# Patient Record
Sex: Male | Born: 1955 | Race: White | Hispanic: No | Marital: Single | Smoking: Former smoker
Health system: Southern US, Community
[De-identification: ages and names within clinical notes are randomized; demographics above are authoritative.]

## PROBLEM LIST (undated history)

## (undated) DIAGNOSIS — E669 Obesity, unspecified: Secondary | ICD-10-CM

## (undated) DIAGNOSIS — K5090 Crohn's disease, unspecified, without complications: Secondary | ICD-10-CM

## (undated) DIAGNOSIS — I10 Essential (primary) hypertension: Secondary | ICD-10-CM

## (undated) DIAGNOSIS — J449 Chronic obstructive pulmonary disease, unspecified: Secondary | ICD-10-CM

## (undated) HISTORY — PX: FOOT SURGERY: SHX648

---

## 1999-05-19 ENCOUNTER — Emergency Department (HOSPITAL_COMMUNITY): Admission: EM | Admit: 1999-05-19 | Discharge: 1999-05-19 | Payer: Self-pay | Admitting: Emergency Medicine

## 2009-06-22 ENCOUNTER — Ambulatory Visit: Payer: Self-pay | Admitting: Cardiovascular Disease

## 2009-06-22 ENCOUNTER — Inpatient Hospital Stay (HOSPITAL_COMMUNITY): Admission: EM | Admit: 2009-06-22 | Discharge: 2009-06-23 | Payer: Self-pay | Admitting: Emergency Medicine

## 2011-03-23 LAB — BASIC METABOLIC PANEL
BUN: 12 mg/dL (ref 6–23)
CO2: 26 mEq/L (ref 19–32)
CO2: 29 mEq/L (ref 19–32)
Calcium: 9.1 mg/dL (ref 8.4–10.5)
Chloride: 103 mEq/L (ref 96–112)
Creatinine, Ser: 0.98 mg/dL (ref 0.4–1.5)
GFR calc Af Amer: >60 mL/min (ref >60)
Glucose, Bld: 86 mg/dL (ref 70–99)
Potassium: 3.8 mEq/L (ref 3.5–5.1)
Sodium: 136 mEq/L (ref 135–145)

## 2011-03-23 LAB — CBC
HCT: 44.9 % (ref 39.0–52.0)
Hemoglobin: 15.7 g/dL (ref 13.0–17.0)
MCHC: 34.4 g/dL (ref 30.0–36.0)
MCHC: 35 g/dL (ref 30.0–36.0)
Platelets: 134 10*3/uL — ABNORMAL LOW (ref 150–400)
RBC: 4.64 MIL/uL (ref 4.22–5.81)
RDW: 12.8 % (ref 11.5–15.5)
RDW: 13 % (ref 11.5–15.5)

## 2011-03-23 LAB — LIPID PANEL
HDL: 37 mg/dL — ABNORMAL LOW (ref >39)
Triglycerides: 115 mg/dL (ref <150)

## 2011-03-23 LAB — TROPONIN I: Troponin I: 0.01 ng/mL (ref 0.00–0.06)

## 2011-03-23 LAB — TSH: TSH: 2.081 u[IU]/mL (ref 0.350–4.500)

## 2011-03-23 LAB — CARDIAC PANEL(CRET KIN+CKTOT+MB+TROPI): Relative Index: 3.3 — ABNORMAL HIGH (ref 0.0–2.5)

## 2011-03-23 LAB — CK TOTAL AND CKMB (NOT AT ARMC): Relative Index: 3.4 — ABNORMAL HIGH (ref 0.0–2.5)

## 2011-03-23 LAB — PROTIME-INR
INR: 1 (ref 0.00–1.49)
Prothrombin Time: 13.6 seconds (ref 11.6–15.2)

## 2011-03-23 LAB — D-DIMER, QUANTITATIVE: D-Dimer, Quant: 0.32 ug/mL-FEU (ref 0.00–0.48)

## 2011-04-29 NOTE — Discharge Summary (Signed)
Rick Meadows, Rick Meadows                ACCOUNT NO.:  [id number]   MEDICAL RECORD NO.:  [id number]          PATIENT TYPE:  INP   LOCATION:  3702                         FACILITY:  [facility]   PHYSICIAN:  Noralyn Pick. Eden Emms, MD, FACCDATE OF BIRTH:  May 14, 1956   DATE OF ADMISSION:  06/22/2009  DATE OF DISCHARGE:  06/23/2009                               DISCHARGE SUMMARY   DISCHARGE DIAGNOSIS:  Noncardiac chest pain.   SECONDARY DIAGNOSES:  1. Hypertension.  2. Asthma.  3. Gastroesophageal reflux disease.  4. Anxiety disorder.   PROCEDURES:  Cardiac catheterization.   REASON FOR ADMISSION:  Mr. Hamon is a 55 year old male, with no prior  history of heart disease, but with several cardiac risk factors,  including longstanding tobacco smoking.  He apparently was recently  hospitalized at [facility] with chest pain, ruled out  for myocardial infarction and had an outpatient adenosine stress  Myoview, which was reportedly negative.  He presented to [facility] ED  with recurrent chest pain and was admitted for further evaluation.   HOSPITAL COURSE:  The patient ruled out for myocardial infarction with  negative serial cardiac markers.   Cardiac catheterization was performed, by Dr. Clifton James.   Plan was to proceed with coronary angiography, and this yielded no  angiographic evidence of CAD.  Left ventricular function was normal.  No  further workup was indicated.   The patient was cleared for discharge the following morning, in  hemodynamically stable condition.  There were no noted complications of  the right groin incision site.  Dr. Eden Emms suggested follow up with his  primary care physician, Dr. Marina Goodell, and to consider further evaluation  for GI etiology.   MEDICATION ADJUSTMENTS:  Aspirin down titrated to 81 mg daily.   DISCHARGE LABORATORY DATA:  Normal CBC.  D-dimer 0.32, potassium 4.0,  BUN 12, creatinine 1.0, total cholesterol 162, triglyceride 115, HDL 37,  and  LDL 102.   DISPOSITION:  Stable.   DISCHARGE MEDICATIONS:  1. Coated aspirin 81 mg daily.  2. Ramipril 10 mg daily.  3. Hydrochlorothiazide 12.5 mg daily.  4. Diazepam 2.5 mg as needed.  5. Cyclobenzaprine 10 mg as needed.  6. Lexapro 20 mg daily.  7. Vicodin 5/500 mg as needed.  8. Omeprazole 20 mg daily.  9. Fish oil daily.   INSTRUCTIONS:  1. The patient is to call and schedule followup with his primary care      physician, Dr. Brent Bulla, in approximately 2 weeks.  2. The patient is to contact our office in the event that he develops      any swelling/bleeding of the right groin incision site.   DISCHARGE ENCOUNTER:  Greater than 30 minutes, including physician time.      Gene Serpe, PA-C      Peter C. Eden Emms, MD, [facility]  Electronically Signed    GS/MEDQ  D:  06/23/2009  T:  06/23/2009  Job:  [id number]   cc:   Dr. Brent Bulla

## 2011-04-29 NOTE — H&P (Signed)
NAMECRISTOVAL, Rick Meadows                ACCOUNT NO.:  [id number]   MEDICAL RECORD NO.:  [id number]          PATIENT TYPE:  INP   LOCATION:  3702                         FACILITY:  [facility]   PHYSICIAN:  Noralyn Pick. Eden Emms, MD, FACCDATE OF BIRTH:  [birth year]   DATE OF ADMISSION:  06/22/2009  DATE OF DISCHARGE:                              HISTORY & PHYSICAL   PRIMARY CARDIOLOGIST (NEW):  Theron Arista C. Eden Emms, MD, [facility].   PRIMARY CARE DOCTOR:  Verdia Kuba, MD.   CHIEF COMPLAINT:  Chest pain.   HISTORY OF PRESENT ILLNESS:  Rick Meadows is a 55 year old Caucasian male  with no known history of coronary artery disease but risk factors  including remote tobacco abuse disorder 20-pack year, quitting 10 years  ago, obesity, sedentary lifestyle, and severe hypertension as well as a  history of GERD, depression, and anxiety, presenting with worsening  typical chest pain over the last 6 months.  The patient reports first  noticing chest pain, band-like, brought on with exertion, associated  with shortness of breath, nausea, and diaphoresis approximately 6 months  ago.  He also noted a significant increase in his blood pressure with  activity at that time.  Symptoms have been somewhat stable until about 3  weeks ago when he had especially severe episode while mowing the lawn.  The patient reports symptoms did not totally resolve for 4 days.  They  waxed and waned over that period until he finally presented to his  primary care doctor where physician assistant sent him to [facility] for evaluation.  There, he was evaluated and sent home and told  to follow up with his primary care doctor.  The patient continued to  have symptoms and presented to [facility] where he was  admitted and ruled out for MI over the few days and sent home with new  medications, then had an appointment for adenosine stress Myoview.  He  had his Myoview this last Wednesday June 20, 2009, which was negative.  However, he continues to have symptoms and was seen by his primary care  today who saw lateral T-wave inversion and sent him to [facility] for further eval and possible cardiac catheterization.  Since  arrival to the emergency department here at [facility], the patient has  been asymptomatic, hypotensives in 170s/100s, heart rate within normal  limits, O2 saturation greater than 95% on room air, and EKG with  nonspecific ST changes in inferior lateral leads.   PAST MEDICAL HISTORY:  1. Hypertension.  2. Obesity.  3. Depression.  4. Anxiety.  5. GERD.  6. History of asthma.  7. Seasonal allergies.  8. Chronic low back pain.   SOCIAL HISTORY:  The patient lives in [place] with his wife.  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  He has a  20-pack year smoking history, quitting 10 years ago.  He consumes 8 to  14 drinks per week, not consuming more than 2 to 3 in 1 day.  He denies  illicit drug use.  He takes herbal medications including fish oil and  garlic.  He has had a regular diet up until his symptoms over the last  few months he has tried to have a heart healthy diet.  He does no  regular exercise.   FAMILY HISTORY:  Nonsignificant for premature coronary artery disease.  However, both of his mother and father did have MIs in their 24s.  His  mother had a valvular disorder unspecified.   REVIEW OF SYSTEMS:  The patient reports chronic tinnitus for which he  has received treatments that is ongoing.  He also has chronic insomnia.  He has been nausea, vomiting and diarrhea 3 weeks ago which has since  resolved.  He denies orthopnea, PND, edema, palpitations, presyncope,  cough, or wheezing.  No fevers or chills.  No bleeding noted.  Otherwise  see HPI.  All other systems were reviewed and were negative.   ALLERGIES:  NKDA.   MEDICATIONS:  1. Fexofenadine 180 mg p.o. daily.  2. Diazepam 2.5 mg p.r.n. for anxiety/muscle pain.  3.  Cyclobenzaprine 10 mg p.r.n. for lower back pain.  4. Ramipril 10 mg p.o. daily.  5. HCTZ 12.5 mg daily.  6. Lexapro 20 mg daily.  7. Vicodin 5/500 mg p.r.n.  8. Fish oil.  9. Garlic.  10.Omeprazole 20 mg p.o. daily.  11.Aspirin 325 mg p.o. daily.   PHYSICAL EXAMINATION:  VITAL SIGNS:  Temp 98.3 degrees Fahrenheit, BP  147/85 down from 170/100, pulse 71, respiration rate 20, O2 saturations  97% on room air.  GENERAL:  The patient is alert and oriented x3, and in no apparent  distress.  Able to speak and move easily without any respiratory  distress  HEENT:  Head, normocephalic and atraumatic.  Pupils are equal, round,  and reactive to light.  Extraocular muscles are intact.  Nares are clear  and without discharge.  Dentition is good.  Oropharynx is without  erythema or exudate.  NECK:  Supple without lymphadenopathy.  No JVD.  No thyromegaly.  No  bruits.  HEART:  Regular rate with audible S1 and S2.  No clicks, rubs, murmurs,  or gallops.  Pulses are 2+ and equal in both upper extremities  bilaterally.  LUNGS:  Clear to auscultation bilaterally.  SKIN:  No rashes, lesions, or petechiae.  ABDOMEN:  Soft, nontender, and nondistended.  Normal abdominal bowel  sounds.  No rebound or guarding.  No hepatosplenomegaly.  No pulsations.  EXTREMITIES:  No clubbing, cyanosis, or edema.  MUSCULOSKELETAL:  No joint deformities or effusions.  No spinal or CVA  tenderness.  NEURO:  Cranial nerves II through XII are grossly intact.  Strength is  5/5 in all extremities and axial groups.  Normal sensation throughout.  Normal cerebellar function.   RADIOLOGY:  Chest x-ray pending.   EKG, sinus rhythm at a rate of 68 with nonspecific ST-T wave changes, no  significant Q waves.  Axis is normal.  No evidence of hypertrophy.  PR  188, QRS 94, QTc 489, T-wave inversion seen in the lateral leads from  earlier EKG today have resolved.   LABORATORY DATA:  Pending.   ASSESSMENT AND PLAN:  Rick Meadows  is a 55 year old Caucasian male with no  known history of coronary artery disease with multiple risk factors  described above who has had ongoing chest pain over the last 6 months  with significant increase in blood pressure associated with his  symptomatology.  The patient has had a negative workup thus far but  due  to his compelling history and risk factors as well as his ongoing  symptoms, we favor cardiac catheterization planning for later this  afternoon as the patient has eaten this morning.  We will continue his  home medications.  No heparin at this time as the patient is  hemodynamically stable and asymptomatic.  Routine labs and chest x-ray  are all pending.  We will admit to telemetry at this time.      Jarrett Ables, PAC      Peter C. Eden Emms, MD, [facility]  Electronically Signed    MS/MEDQ  D:  06/22/2009  T:  06/23/2009  Job:  [id number]

## 2011-04-29 NOTE — Cardiovascular Report (Signed)
Rick Meadows, Rick Meadows                ACCOUNT NO.:  [id number]   MEDICAL RECORD NO.:  [id number]          PATIENT TYPE:  INP   LOCATION:  1828                         FACILITY:  [facility]   PHYSICIAN:  Verne Carrow, MDDATE OF BIRTH:  Nov 20, 1956   DATE OF PROCEDURE:  06/22/2009  DATE OF DISCHARGE:                            CARDIAC CATHETERIZATION   PRIMARY CARDIOLOGIST:  Theron Arista C. Eden Emms, MD, [facility]   PROCEDURES PERFORMED:  1. Left heart catheterization  2. Selective coronary angiography.  3. Left ventricular angiogram.   OPERATOR:  Verne Carrow, MD   INDICATIONS:  Chest pain in a 55 year old Caucasian male with a history  of hypertension and remote tobacco abuse.   PROCEDURE IN DETAIL:  The patient was brought into the main cardiac  catheterization laboratory after signing informed consent for the  procedure.  The right groin was prepped and draped in a sterile fashion.  Lidocaine 1% was used for local anesthesia.  Standard diagnostic  catheters were used to perform selective coronary angiography.  A  pigtail catheter was used to perform a left ventricular angiogram.  The  patient tolerated the procedure well and was taken to the holding area  in stable condition.   HEMODYNAMIC FINDINGS:  Central aortic pressure 121/81.  Left ventricular  pressure 115/7.  Left ventricular end-diastolic pressure 16.   ANGIOGRAPHIC FINDINGS:  1. The left main coronary artery had no evidence of disease.  2. The circumflex artery was composed mainly of a large obtuse      marginal branch.  There was no angiographic evidence of disease in      this system.  3. Left anterior descending is a large vessel that courses to the apex      and gives off two moderate-sized diagonal branches.  There is no      angiographic evidence of significant disease in this system.  4. The right coronary artery is a large dominant vessel that gives off      a posterior descending artery and a posterolateral  branch.  There      is no angiographic evidence of disease in this system.  5. Left ventricular angiogram was performed in the RAO projection and      shows normal left ventricular systolic function with no wall motion      abnormalities.  Ejection fraction 65%.   IMPRESSION:  1. No angiographic evidence of coronary artery disease.  2. Normal left ventricular systolic function.   RECOMMENDATIONS:  No further ischemic workup at this time.  The patient  will be admitted tonight for bedrest.      Verne Carrow, MD  Electronically Signed     CM/MEDQ  D:  06/22/2009  T:  06/23/2009  Job:  [id number]

## 2015-01-07 ENCOUNTER — Emergency Department (HOSPITAL_COMMUNITY): Payer: PRIVATE HEALTH INSURANCE

## 2015-01-07 ENCOUNTER — Inpatient Hospital Stay (HOSPITAL_COMMUNITY)
Admission: EM | Admit: 2015-01-07 | Discharge: 2015-01-10 | DRG: 313 | Disposition: A | Discharge status: Home / Self Care | Payer: PRIVATE HEALTH INSURANCE | Attending: Internal Medicine | Admitting: Internal Medicine

## 2015-01-07 ENCOUNTER — Encounter (HOSPITAL_COMMUNITY): Payer: Self-pay | Admitting: Emergency Medicine

## 2015-01-07 DIAGNOSIS — R079 Chest pain, unspecified: Secondary | ICD-10-CM

## 2015-01-07 DIAGNOSIS — I10 Essential (primary) hypertension: Secondary | ICD-10-CM | POA: Diagnosis present

## 2015-01-07 DIAGNOSIS — I82409 Acute embolism and thrombosis of unspecified deep veins of unspecified lower extremity: Secondary | ICD-10-CM | POA: Diagnosis present

## 2015-01-07 DIAGNOSIS — J449 Chronic obstructive pulmonary disease, unspecified: Secondary | ICD-10-CM | POA: Diagnosis present

## 2015-01-07 DIAGNOSIS — E669 Obesity, unspecified: Secondary | ICD-10-CM | POA: Diagnosis present

## 2015-01-07 DIAGNOSIS — Z87891 Personal history of nicotine dependence: Secondary | ICD-10-CM

## 2015-01-07 DIAGNOSIS — R0602 Shortness of breath: Secondary | ICD-10-CM

## 2015-01-07 DIAGNOSIS — Z7951 Long term (current) use of inhaled steroids: Secondary | ICD-10-CM

## 2015-01-07 DIAGNOSIS — Z6835 Body mass index (BMI) 35.0-35.9, adult: Secondary | ICD-10-CM

## 2015-01-07 DIAGNOSIS — F1099 Alcohol use, unspecified with unspecified alcohol-induced disorder: Secondary | ICD-10-CM | POA: Diagnosis present

## 2015-01-07 DIAGNOSIS — I82442 Acute embolism and thrombosis of left tibial vein: Secondary | ICD-10-CM | POA: Diagnosis present

## 2015-01-07 DIAGNOSIS — K5090 Crohn's disease, unspecified, without complications: Secondary | ICD-10-CM | POA: Diagnosis present

## 2015-01-07 DIAGNOSIS — Z7982 Long term (current) use of aspirin: Secondary | ICD-10-CM

## 2015-01-07 DIAGNOSIS — F1010 Alcohol abuse, uncomplicated: Secondary | ICD-10-CM | POA: Diagnosis present

## 2015-01-07 DIAGNOSIS — Z79899 Other long term (current) drug therapy: Secondary | ICD-10-CM

## 2015-01-07 HISTORY — DX: Chronic obstructive pulmonary disease, unspecified: J44.9

## 2015-01-07 HISTORY — DX: Obesity, unspecified: E66.9

## 2015-01-07 HISTORY — DX: Crohn's disease, unspecified, without complications: K50.90

## 2015-01-07 HISTORY — DX: Essential (primary) hypertension: I10

## 2015-01-07 LAB — I-STAT TROPONIN, ED: Troponin i, poc: 0 ng/mL (ref 0.00–0.08)

## 2015-01-07 LAB — CBC
HEMATOCRIT: 43.9 % (ref 39.0–52.0)
Hemoglobin: 14.9 g/dL (ref 13.0–17.0)
MCH: 31.6 pg (ref 26.0–34.0)
MCHC: 33.9 g/dL (ref 30.0–36.0)
MCV: 93.2 fL (ref 78.0–100.0)
PLATELETS: 96 10*3/uL — AB (ref 150–400)
RBC: 4.71 MIL/uL (ref 4.22–5.81)
RDW: 13.2 % (ref 11.5–15.5)
WBC: 9.8 10*3/uL (ref 4.0–10.5)

## 2015-01-07 LAB — BASIC METABOLIC PANEL
Anion gap: 6 (ref 5–15)
BUN: 16 mg/dL (ref 6–23)
CALCIUM: 8.7 mg/dL (ref 8.4–10.5)
CO2: 25 mmol/L (ref 19–32)
CREATININE: 0.86 mg/dL (ref 0.50–1.35)
Chloride: 101 mmol/L (ref 96–112)
GFR calc non Af Amer: >90 mL/min (ref >90)
Glucose, Bld: 150 mg/dL — ABNORMAL HIGH (ref 70–99)
POTASSIUM: 3.8 mmol/L (ref 3.5–5.1)
SODIUM: 132 mmol/L — AB (ref 135–145)

## 2015-01-07 LAB — BRAIN NATRIURETIC PEPTIDE: B Natriuretic Peptide: 20.1 pg/mL (ref 0.0–100.0)

## 2015-01-07 NOTE — ED Provider Notes (Signed)
CSN: [id number]     Arrival date & time 01/07/15  2250 History  This chart was scribed for Olivia Mackie, MD by Annye Asa, ED Scribe. This patient was seen in room D34C/D34C and the patient's care was started at 11:52 PM.    Chief Complaint  Patient presents with  . Chest Pain   Patient is a 59 y.o. male presenting with chest pain. The history is provided by the patient. No language interpreter was used.  Chest Pain Associated symptoms: shortness of breath      HPI Comments: Rick Meadows is a 59 y.o. male with past medical history of HTN, COPD, Crohn's disease who presents to the Emergency Department complaining of chest pain and SOB, beginning this afternoon. He describes his chest pain as a "heaviness" rated 6/10; his SOB is worse with ambulation. Tonight, patient explains that he was walking through [place] and had to use a motorized scooter due to current symptoms, which was unusual for him. He also reports that his left hand began tingling tonight. He is currently utilizing an inhaler for COPD as needed; he was previously on steroids but finished that course three days PTA. He denies any unusual swelling, nausea, diaphoresis, cough, congestion.   Patient repots that two weeks PTA he went to [facility] and was diagnosed with COPD; at that time he was told he had "had a heart attack" via a stress test. He was in the hospital for 6 days total; he has work two days since that time.   Past Medical History  Diagnosis Date  . Hypertension   . COPD (chronic obstructive pulmonary disease)   . Crohn's disease    History reviewed. No pertinent past surgical history. No family history on file. History  Substance Use Topics  . Smoking status: Former Games developer  . Smokeless tobacco: Not on file  . Alcohol Use: No    Review of Systems  Respiratory: Positive for shortness of breath.   Cardiovascular: Positive for chest pain.  Neurological:       Paresthesia of the left hand  All other  systems reviewed and are negative.   Allergies  Review of patient's allergies indicates no known allergies.  Home Medications   Prior to Admission medications   Not on File   BP 131/66 mmHg  Pulse 66  Temp(Src) 97.9 F (36.6 C) (Oral)  Resp 24  Ht  (1.93 m)  Wt 290 lb (131.543 kg)  BMI 35.31 kg/m2  SpO2 94% Physical Exam  Constitutional: He is oriented to person, place, and time. He appears well-developed and well-nourished.  HENT:  Head: Normocephalic and atraumatic.  Mouth/Throat: Oropharynx is clear and moist. No oropharyngeal exudate.  Eyes: EOM are normal. Pupils are equal, round, and reactive to light.  Cardiovascular: Normal rate, regular rhythm and normal heart sounds.  Exam reveals no gallop and no friction rub.   No murmur heard. Pulmonary/Chest: Effort normal. No respiratory distress. He has no wheezes. He has no rales.  Prolonged expiratory phase  Abdominal: Soft. There is no tenderness. There is no rebound and no guarding.  Musculoskeletal: Normal range of motion. He exhibits no edema.  Neurological: He is alert and oriented to person, place, and time.  Skin: Skin is warm and dry. No rash noted.  Psychiatric: He has a normal mood and affect. His behavior is normal.  Nursing note and vitals reviewed.   ED Course  Procedures   DIAGNOSTIC STUDIES: Oxygen Saturation is 94% on RA, low by  my interpretation.    COORDINATION OF CARE: 12:00 AM Discussed treatment plan with pt at bedside and pt agreed to plan.  Labs Review Labs Reviewed  CBC - Abnormal; Notable for the following:    Platelets 96 (*)    All other components within normal limits  BASIC METABOLIC PANEL - Abnormal; Notable for the following:    Sodium 132 (*)    Glucose, Bld 150 (*)    All other components within normal limits  BRAIN NATRIURETIC PEPTIDE  TROPONIN I  TROPONIN I  TROPONIN I  I-STAT TROPOININ, ED    Imaging Review Dg Chest 2 View  01/07/2015   CLINICAL DATA:   Left-sided chest and arm pain with shortness of breath tonight. History of hypertension and COPD.  EXAM: CHEST  2 VIEW  COMPARISON:  12/17/2014  FINDINGS: Normal heart size and pulmonary vascularity. Linear fibrosis or atelectasis in the lung bases. No focal airspace consolidation. Probable old left rib fractures. Tortuous aorta. Mediastinal contours appear intact. No blunting of costophrenic angles. No pneumothorax.  IMPRESSION: Shallow inspiration with atelectasis or fibrosis in the lung bases. No active consolidation.   Electronically Signed   By: Burman Nieves M.D.   On: 01/07/2015 23:35     EKG Interpretation   Date/Time:  Sunday January 07 2015 22:54:42 EST Ventricular Rate:  65 PR Interval:  166 QRS Duration: 88 QT Interval:  400 QTC Calculation: 416 R Axis:   75 Text Interpretation:  Normal sinus rhythm Nonspecific ST and T wave  abnormality Abnormal ECG Confirmed by Lita Flynn  MD, Shareef Eddinger ([id number]) on 01/07/2015  10:59:29 PM      MDM   Final diagnoses:  Chest pain  SOB (shortness of breath)    I personally performed the services described in this documentation, which was scribed in my presence. The recorded information has been reviewed and is accurate.     3:09 AM Prior records reviewed from [facility].  Patient was admitted on 12/17/2014, discharged on 12/23/2014.  Diagnosis on upon discharge 1.  Acute exacerbation of COPD, discharged on Levaquin and steroid taper.  2.  Alcohol withdrawal, 3.  Abnormal EKG, he had a Cardiolite stress test negative for ischemia.  EF was 68%.  4.  Hypertension emergency, patient had diltiazem added to his regimen and beta blocker was decreased.  #5.  Crohn's disease.  Per patient, he had been doing well since discharge, but over the last day while walking.  He has had worsening symptoms with substernal chest pressure radiation down his left arm, which he did not have during his prior evaluation.  No wheezing on exam.  He does not appear to be  in a COPD exacerbation.  Will consult cardiology for their evaluation.  Consulted Dr Donnie Aho who feels pt does not require cardiology admission at this time, heart cath done 2010 under similar circumstances, but does feel pt will need cycling of enzymes under hospitalist care.  Olivia Mackie, MD 01/08/15 [phone]

## 2015-01-07 NOTE — ED Notes (Signed)
Pt. reports chest heaviness across his chest ans SOB  onset this afternoon , denies nausea or diaphoresis . No cough or congestion .

## 2015-01-08 ENCOUNTER — Encounter (HOSPITAL_COMMUNITY): Payer: Self-pay | Admitting: Cardiology

## 2015-01-08 ENCOUNTER — Observation Stay (HOSPITAL_COMMUNITY): Payer: PRIVATE HEALTH INSURANCE

## 2015-01-08 DIAGNOSIS — R079 Chest pain, unspecified: Secondary | ICD-10-CM

## 2015-01-08 DIAGNOSIS — K5090 Crohn's disease, unspecified, without complications: Secondary | ICD-10-CM | POA: Diagnosis present

## 2015-01-08 DIAGNOSIS — I10 Essential (primary) hypertension: Secondary | ICD-10-CM | POA: Diagnosis present

## 2015-01-08 DIAGNOSIS — J449 Chronic obstructive pulmonary disease, unspecified: Secondary | ICD-10-CM | POA: Diagnosis present

## 2015-01-08 DIAGNOSIS — F1010 Alcohol abuse, uncomplicated: Secondary | ICD-10-CM | POA: Diagnosis present

## 2015-01-08 LAB — TROPONIN I: Troponin I: <0.03 ng/mL (ref <0.031)

## 2015-01-08 MED ORDER — THIAMINE HCL 100 MG/ML IJ SOLN
100.0000 mg | Freq: Every day | INTRAMUSCULAR | Status: DC
  Filled 2015-01-08 (×3): qty 1

## 2015-01-08 MED ORDER — HEPARIN SODIUM (PORCINE) 5000 UNIT/ML IJ SOLN
5000.0000 [IU] | Freq: Three times a day (TID) | INTRAMUSCULAR | Status: DC
  Filled 2015-01-08 (×3): qty 1

## 2015-01-08 MED ORDER — ALBUTEROL SULFATE (2.5 MG/3ML) 0.083% IN NEBU: 2.5000 mg | INHALATION_SOLUTION | Freq: Four times a day (QID) | RESPIRATORY_TRACT | Status: DC | PRN

## 2015-01-08 MED ORDER — ADULT MULTIVITAMIN W/MINERALS CH
ORAL_TABLET | Freq: Every day | ORAL | Status: DC
  Filled 2015-01-08: qty 1

## 2015-01-08 MED ORDER — REGADENOSON 0.4 MG/5ML IV SOLN
0.4000 mg | Freq: Once | INTRAVENOUS | Status: AC
Start: 2015-01-08 — End: 2015-01-08
  Administered 2015-01-08: 0.4 mg via INTRAVENOUS
  Filled 2015-01-08: qty 5

## 2015-01-08 MED ORDER — PANTOPRAZOLE SODIUM 40 MG PO TBEC
40.0000 mg | DELAYED_RELEASE_TABLET | Freq: Every day | ORAL | Status: DC
  Administered 2015-01-08 – 2015-01-10 (×3): 40 mg via ORAL

## 2015-01-08 MED ORDER — MESALAMINE 400 MG PO CPDR
1600.0000 mg | DELAYED_RELEASE_CAPSULE | Freq: Two times a day (BID) | ORAL | Status: DC
  Administered 2015-01-08 – 2015-01-10 (×5): 1600 mg via ORAL
  Filled 2015-01-08 (×8): qty 4

## 2015-01-08 MED ORDER — ADULT MULTIVITAMIN W/MINERALS CH
1.0000 | ORAL_TABLET | Freq: Every day | ORAL | Status: DC
  Administered 2015-01-08 – 2015-01-10 (×3): 1 via ORAL
  Filled 2015-01-08 (×3): qty 1

## 2015-01-08 MED ORDER — ACETAMINOPHEN 325 MG PO TABS: 650.0000 mg | ORAL_TABLET | ORAL | Status: DC | PRN

## 2015-01-08 MED ORDER — ALBUTEROL SULFATE HFA 108 (90 BASE) MCG/ACT IN AERS: 2.0000 | INHALATION_SPRAY | Freq: Four times a day (QID) | RESPIRATORY_TRACT | Status: DC | PRN

## 2015-01-08 MED ORDER — ASPIRIN EC 325 MG PO TBEC
325.0000 mg | DELAYED_RELEASE_TABLET | Freq: Every day | ORAL | Status: DC
  Administered 2015-01-08 – 2015-01-10 (×3): 325 mg via ORAL
  Filled 2015-01-08 (×3): qty 1

## 2015-01-08 MED ORDER — FOLIC ACID 1 MG PO TABS
1.0000 mg | ORAL_TABLET | Freq: Every day | ORAL | Status: DC
  Administered 2015-01-08 – 2015-01-10 (×3): 1 mg via ORAL
  Filled 2015-01-08 (×3): qty 1

## 2015-01-08 MED ORDER — ONDANSETRON HCL 4 MG/2ML IJ SOLN: 4.0000 mg | Freq: Four times a day (QID) | INTRAMUSCULAR | Status: DC | PRN

## 2015-01-08 MED ORDER — BENAZEPRIL-HYDROCHLOROTHIAZIDE 20-25 MG PO TABS: 1.0000 | ORAL_TABLET | Freq: Every day | ORAL | Status: DC

## 2015-01-08 MED ORDER — REGADENOSON 0.4 MG/5ML IV SOLN
INTRAVENOUS | Status: AC
  Administered 2015-01-08: 0.4 mg via INTRAVENOUS
  Filled 2015-01-08: qty 5

## 2015-01-08 MED ORDER — METOPROLOL TARTRATE 25 MG PO TABS
25.0000 mg | ORAL_TABLET | Freq: Two times a day (BID) | ORAL | Status: DC
  Administered 2015-01-08 – 2015-01-10 (×5): 25 mg via ORAL
  Filled 2015-01-08 (×6): qty 1

## 2015-01-08 MED ORDER — LORAZEPAM 2 MG/ML IJ SOLN: 1.0000 mg | Freq: Four times a day (QID) | INTRAMUSCULAR | Status: DC | PRN

## 2015-01-08 MED ORDER — VITAMIN B-1 100 MG PO TABS
100.0000 mg | ORAL_TABLET | Freq: Every day | ORAL | Status: DC
  Administered 2015-01-08 – 2015-01-10 (×3): 100 mg via ORAL
  Filled 2015-01-08 (×3): qty 1

## 2015-01-08 MED ORDER — MOMETASONE FURO-FORMOTEROL FUM 100-5 MCG/ACT IN AERO
2.0000 | INHALATION_SPRAY | Freq: Two times a day (BID) | RESPIRATORY_TRACT | Status: DC
  Administered 2015-01-08 – 2015-01-10 (×3): 2 via RESPIRATORY_TRACT
  Filled 2015-01-08 (×2): qty 8.8

## 2015-01-08 MED ORDER — DILTIAZEM HCL ER 240 MG PO CP24
240.0000 mg | ORAL_CAPSULE | Freq: Every day | ORAL | Status: DC
  Administered 2015-01-08 – 2015-01-10 (×3): 240 mg via ORAL
  Filled 2015-01-08 (×3): qty 1

## 2015-01-08 MED ORDER — LORAZEPAM 1 MG PO TABS: 1.0000 mg | ORAL_TABLET | Freq: Four times a day (QID) | ORAL | Status: DC | PRN

## 2015-01-08 MED ORDER — ESCITALOPRAM OXALATE 20 MG PO TABS
20.0000 mg | ORAL_TABLET | Freq: Every day | ORAL | Status: DC
  Administered 2015-01-08 – 2015-01-10 (×3): 20 mg via ORAL
  Filled 2015-01-08 (×3): qty 1

## 2015-01-08 MED ORDER — TECHNETIUM TC 99M SESTAMIBI GENERIC - CARDIOLITE: 30.0000 | Freq: Once | INTRAVENOUS | Status: AC | PRN

## 2015-01-08 MED ORDER — TECHNETIUM TC 99M SESTAMIBI GENERIC - CARDIOLITE
10.0000 | Freq: Once | INTRAVENOUS | Status: AC | PRN
  Administered 2015-01-08: 30 via INTRAVENOUS

## 2015-01-08 MED ORDER — HYDROCHLOROTHIAZIDE 25 MG PO TABS
25.0000 mg | ORAL_TABLET | Freq: Every day | ORAL | Status: DC
  Administered 2015-01-08 – 2015-01-10 (×3): 25 mg via ORAL
  Filled 2015-01-08 (×3): qty 1

## 2015-01-08 MED ORDER — BENAZEPRIL HCL 20 MG PO TABS
20.0000 mg | ORAL_TABLET | Freq: Every day | ORAL | Status: DC
  Administered 2015-01-08 – 2015-01-10 (×3): 20 mg via ORAL
  Filled 2015-01-08 (×3): qty 1

## 2015-01-08 MED ORDER — IPRATROPIUM-ALBUTEROL 0.5-2.5 (3) MG/3ML IN SOLN
3.0000 mL | Freq: Once | RESPIRATORY_TRACT | Status: AC
  Administered 2015-01-08: 3 mL via RESPIRATORY_TRACT
  Filled 2015-01-08: qty 3

## 2015-01-08 MED ORDER — TRAZODONE HCL 50 MG PO TABS
50.0000 mg | ORAL_TABLET | Freq: Every day | ORAL | Status: DC
  Administered 2015-01-08 – 2015-01-09 (×2): 50 mg via ORAL
  Filled 2015-01-08 (×3): qty 1

## 2015-01-08 NOTE — ED Notes (Signed)
Pt reports chest heaviness that began today, pt recently admitted to [facility] x6 days for same w/ cardiac workup - pt was dx w/ COPD and given albuterol MDI. Pt admits to exertional SOB and pain w/ inspiration as well.

## 2015-01-08 NOTE — H&P (Signed)
[facility] History and Physical  Rick Meadows MWU:[id number] DOB: [birth year] DOA: 01/07/2015  Referring physician: EDP PCP: Verne Carrow, MD   Chief Complaint: Chest pain   HPI: Rick Meadows is a 59 y.o. male who presents to the ED with c/o chest pain and SOB.  Symptoms onset this afternoon.  Pain is a "heaviness" rated 6/10 in his left chest.  SOB worse with ambulation.  Had to use a motorized scooter today at [facility] due to his symptoms which is unusual for him.  Left hand began tingling tonight.  Was admitted to [facility] 2 weeks PTA and diagnosed with COPD and started on steroid taper which ended 3 days ago.  He also had a stress test during that admit which was apparently negative.  Review of Systems: Systems reviewed.  As above, otherwise negative  Past Medical History  Diagnosis Date  . Hypertension   . COPD (chronic obstructive pulmonary disease)   . Crohn's disease   . Obesity (BMI 30-39.9)    Past Surgical History  Procedure Laterality Date  . Foot surgery     Social History:  reports that he has quit smoking. He has never used smokeless tobacco. He reports that he drinks alcohol. He reports that he does not use illicit drugs.  No Known Allergies  No family history on file.   Prior to Admission medications   Medication Sig Start Date End Date Taking? Authorizing Provider  albuterol (PROVENTIL HFA;VENTOLIN HFA) 108 (90 BASE) MCG/ACT inhaler Inhale 2 puffs into the lungs every 6 (six) hours as needed for wheezing or shortness of breath.   Yes Historical Provider, MD  aspirin 325 MG tablet Take 325 mg by mouth daily.   Yes Historical Provider, MD  benazepril-hydrochlorthiazide (LOTENSIN HCT) 20-25 MG per tablet Take 1 tablet by mouth daily.   Yes Historical Provider, MD  diltiazem (DILACOR XR) 240 MG 24 hr capsule Take 240 mg by mouth daily.   Yes Historical Provider, MD  escitalopram (LEXAPRO) 20 MG tablet Take 20 mg by mouth daily.   Yes  Historical Provider, MD  Fluticasone-Salmeterol (ADVAIR) 250-50 MCG/DOSE AEPB Inhale 1 puff into the lungs 2 (two) times daily.   Yes Historical Provider, MD  lansoprazole (PREVACID) 30 MG capsule Take 30 mg by mouth daily at 12 noon.   Yes Historical Provider, MD  Mesalamine 800 MG TBEC Take 2 tablets by mouth 2 (two) times daily.   Yes Historical Provider, MD  metoprolol tartrate (LOPRESSOR) 25 MG tablet Take 25 mg by mouth 2 (two) times daily.   Yes Historical Provider, MD  Multiple Vitamins-Minerals (MENS 50+ MULTI VITAMIN/MIN PO) Take 1 tablet by mouth daily.   Yes Historical Provider, MD  traZODone (DESYREL) 50 MG tablet Take 50 mg by mouth at bedtime.   Yes Historical Provider, MD  triamcinolone cream (KENALOG) 0.5 % Apply 1 application topically daily as needed (rash in ear).   Yes Historical Provider, MD   Physical Exam: Filed Vitals:   01/08/15 0300  BP: 118/54  Pulse: 73  Temp:   Resp: 23    BP 118/54 mmHg  Pulse 73  Temp(Src) 97.9 F (36.6 C) (Oral)  Resp 23  Ht  (1.93 m)  Wt 131.543 kg (290 lb)  BMI 35.31 kg/m2  SpO2 93%  General Appearance:    Alert, oriented, no distress, appears stated age  Head:    Normocephalic, atraumatic  Eyes:    PERRL, EOMI, sclera non-icteric        Nose:  Nares without drainage or epistaxis. Mucosa, turbinates normal  Throat:   Moist mucous membranes. Oropharynx without erythema or exudate.  Neck:   Supple. No carotid bruits.  No thyromegaly.  No lymphadenopathy.   Back:     No CVA tenderness, no spinal tenderness  Lungs:     Clear to auscultation bilaterally, without wheezes, rhonchi or rales  Chest wall:    No tenderness to palpitation  Heart:    Regular rate and rhythm without murmurs, gallops, rubs  Abdomen:     Soft, non-tender, nondistended, normal bowel sounds, no organomegaly  Genitalia:    deferred  Rectal:    deferred  Extremities:   No clubbing, cyanosis or edema.  Pulses:   2+ and symmetric all extremities  Skin:    Skin color, texture, turgor normal, no rashes or lesions  Lymph nodes:   Cervical, supraclavicular, and axillary nodes normal  Neurologic:   CNII-XII intact. Normal strength, sensation and reflexes      throughout    Labs on Admission:  Basic Metabolic Panel:  Recent Labs Lab 01/07/15 2306  NA 132*  K 3.8  CL 101  CO2 25  GLUCOSE 150*  BUN 16  CREATININE 0.86  CALCIUM 8.7   Liver Function Tests: No results for input(s): AST, ALT, ALKPHOS, BILITOT, PROT, ALBUMIN in the last 168 hours. No results for input(s): LIPASE, AMYLASE in the last 168 hours. No results for input(s): AMMONIA in the last 168 hours. CBC:  Recent Labs Lab 01/07/15 2306  WBC 9.8  HGB 14.9  HCT 43.9  MCV 93.2  PLT 96*   Cardiac Enzymes: No results for input(s): CKTOTAL, CKMB, CKMBINDEX, TROPONINI in the last 168 hours.  BNP (last 3 results) No results for input(s): PROBNP in the last 8760 hours. CBG: No results for input(s): GLUCAP in the last 168 hours.  Radiological Exams on Admission: Dg Chest 2 View  01/07/2015   CLINICAL DATA:  Left-sided chest and arm pain with shortness of breath tonight. History of hypertension and COPD.  EXAM: CHEST  2 VIEW  COMPARISON:  12/17/2014  FINDINGS: Normal heart size and pulmonary vascularity. Linear fibrosis or atelectasis in the lung bases. No focal airspace consolidation. Probable old left rib fractures. Tortuous aorta. Mediastinal contours appear intact. No blunting of costophrenic angles. No pneumothorax.  IMPRESSION: Shallow inspiration with atelectasis or fibrosis in the lung bases. No active consolidation.   Electronically Signed   By: Burman Nieves M.D.   On: 01/07/2015 23:35    EKG: Independently reviewed.  Assessment/Plan Principal Problem:   Chest pain Active Problems:   COPD (chronic obstructive pulmonary disease)   HTN (hypertension)   Crohn's disease   1. Chest pain - less likely to be ischemic in nature given the negative stress test 2  weeks ago at [place], and the negative heart cath in 2010 which showed no disease. 1. Admit patient for obs 2. Tele monitor 3. Trend troponins 4. Cardiology has seen in consult already (See. Dr. York Spaniel note) 5. Will keep NPO in case his troponins come back positive and he ends up needing heart cath 2. EtOH abuse - put patient on CIWA protocol in case he goes into withdrawal, sounds like he did have some withdrawal symptoms last week while at [place]. 3. COPD - continue home nebs 4. HTN - continue home meds 5. Crohn's disease - continue home meds  Dr. Donnie Aho has already seen patient in consult.  Code Status: Full Code  Family Communication: Wife at bedside  Disposition Plan: Admit to obs  Time spent: 70 min  GARDNER, JARED M. [facility] Pager [phone]  If 7AM-7PM, please contact the day team taking care of the patient [email or web address] Password [facility] 01/08/2015, 4:15 AM

## 2015-01-08 NOTE — Progress Notes (Signed)
   Follow-up to Dr. York Spaniel consult note from 4:02 AM today. See note for full details. Patient admitted for chest pain. H/o normal cath in 2010 and recent NST at [facility] 2 weeks ago which was negative for ischemia. Cardiac enzymes are negative x 3. On physical exam, patient noted to have slightly noticeable difference in R cath size compared to contralateral. He notes recent 4 day hospital stay at [facility] 2 weeks ago for COPD exacerbation and had leg pain at that time. On further questioning, he has had a pleuritic component with his CP in addition to dyspnea. Has family h/o of DVT in mother and brother and h/o of PE with sister. Recommend consideration for LE doppler to r/o DVT. With normal coronaries on cath in 2010, recent low risk NST 2 weeks ago and negative cardiac enzymes x 3, no further cardiac w/u is indicated at this time.   SIMMONS, BRITTAINY 01/08/2015    I have seen, examined and evaluated the patient this AM along with Ms. Simmons.  After reviewing all the available data and chart,  I agree with her findings, examination as well as impression recommendations.  As it would seem, the patient is actually down in Nuc med already with study underway for Stress Test --> we will await results.   If negative, would pursue non-cardiac etiology.     Marykay Lex, M.D., M.S. Interventional Cardiologist   Pager # [phone]

## 2015-01-08 NOTE — Progress Notes (Signed)
59 year old male admitted for chest pain. He had cardiac cath done in 2012 and showed clean coronaries. But be reports left sided heaviness and chest pain with radiation tot he left arm on activity. ACS ruled out. Cardiology consulted and NM stress test ordered .  Continue to monitor. His chest pain is resolved now.   Kathlen Mody, MD [phone]

## 2015-01-08 NOTE — Progress Notes (Signed)
UR completed 

## 2015-01-08 NOTE — Consult Note (Signed)
Cardiology Consult Note  Admit date: 01/07/2015 Name: Rick Meadows 59 y.o.  male DOB:  May 04, 1956 MRN:  [id number]  Today's date:  01/08/2015  Referring Physician:    [facility]  Primary Physician:    Dr. Marina Goodell  Reason for Consultation:    Chest pain  IMPRESSIONS: 1.  Prolonged chest discomfort with some features suggestive of angina but with recent negative myocardial perfusion scan and similar admission 5 years ago with normal coronary arteries. 2.  Hypertension. 3.  Obesity. 4.  Recent COPD exacerbation. 5.  Alcohol use. 6.  Crohn's disease  RECOMMENDATION: 1.  Cycle troponins. 2.  Review records from recent hospitalization. 3.  Depending on clinical course decision about whether we need to have repeat catheterization or evaluation for other causes.  HISTORY: This 59 year old male has a history of Crohn's disease and hypertension as well as obesity.  In 2010, he developed chest discomfort and diaphoresis as well as high blood pressure and was eventually evaluated [facility] and later [facility] at which point in time, he had a negative Myoview.  He continued to have symptoms and was seen at [facility] and had catheterization that did not show any coronary artery disease.  He was admitted to [facility] 2 weeks ago with severe shortness of breath was felt to have a COPD exacerbation.  He had marked elevation of blood pressure and eventually had a negative myocardial perfusion scan.  There and was treated with steroids and eventually discharged home after a six-day stay.  He was weak on discharge and stayed home a couple of days and was significantly short of breath but this improved.  He noted that he had severe leg pain and could barely walk for a couple of days afterwards but was able to return to work for 2 days.  He was out walking around [place] today when he had the onset of midsternal chest discomfort like an elephant sitting on his  chest.  He sat down and then had carotid motor [place] card out of [employer].  He went home and drank 2 large beers and when the discomfort persisted presented to the [facility] emergency room.  He has very minimal symptoms at the present time.  Initial troponins were negative and the EKG here showed baseline artifact but no significant changes.  He has been moderate sleep sedentary.  He was given a breathing treatment earlier that resulted in some improvement in his symptoms.  He also complained the pain radiated down his left arm.  Past Medical History  Diagnosis Date  . Hypertension   . COPD (chronic obstructive pulmonary disease)   . Crohn's disease   . Obesity (BMI 30-39.9)       Past Surgical History  Procedure Laterality Date  . Foot surgery       Allergies:  has No Known Allergies.   Medications: Prior to Admission medications   Medication Sig Start Date End Date Taking? Authorizing Provider  albuterol (PROVENTIL HFA;VENTOLIN HFA) 108 (90 BASE) MCG/ACT inhaler Inhale 2 puffs into the lungs every 6 (six) hours as needed for wheezing or shortness of breath.   Yes Historical Provider, MD  aspirin 325 MG tablet Take 325 mg by mouth daily.   Yes Historical Provider, MD  benazepril-hydrochlorthiazide (LOTENSIN HCT) 20-25 MG per tablet Take 1 tablet by mouth daily.   Yes Historical Provider, MD  diltiazem (DILACOR XR) 240 MG 24 hr capsule Take 240 mg by mouth daily.   Yes  Historical Provider, MD  escitalopram (LEXAPRO) 20 MG tablet Take 20 mg by mouth daily.   Yes Historical Provider, MD  Fluticasone-Salmeterol (ADVAIR) 250-50 MCG/DOSE AEPB Inhale 1 puff into the lungs 2 (two) times daily.   Yes Historical Provider, MD  lansoprazole (PREVACID) 30 MG capsule Take 30 mg by mouth daily at 12 noon.   Yes Historical Provider, MD  Mesalamine 800 MG TBEC Take 2 tablets by mouth 2 (two) times daily.   Yes Historical Provider, MD  metoprolol tartrate (LOPRESSOR) 25 MG tablet Take 25 mg by mouth 2  (two) times daily.   Yes Historical Provider, MD  Multiple Vitamins-Minerals (MENS 50+ MULTI VITAMIN/MIN PO) Take 1 tablet by mouth daily.   Yes Historical Provider, MD  traZODone (DESYREL) 50 MG tablet Take 50 mg by mouth at bedtime.   Yes Historical Provider, MD  triamcinolone cream (KENALOG) 0.5 % Apply 1 application topically daily as needed (rash in ear).   Yes Historical Provider, MD    Family History: Family Status  Relation Status Death Age  . Father Deceased 83    aneurysm  . Mother Deceased 39    MI  . Brother Deceased     intestinal blockage  . Brother Deceased     liver cancer  . Brother Alive   . Sister      Crohn's disease  . Sister    . Sister    . Sister      Social History:   reports that he has quit smoking. He has never used smokeless tobacco. He reports that he does not drink alcohol or use illicit drugs.   History   Social History Narrative    Review of Systems: Significant obesity.  He does have some mild shortness of breath.  He has some mild chronic tinnitus.  Significant pain in legs as noted above.  Other than as noted above remainder of the review of systems is unremarkable.  Physical Exam: BP 118/54 mmHg  Pulse 73  Temp(Src) 97.9 F (36.6 C) (Oral)  Resp 23  Ht  (1.93 m)  Wt 131.543 kg (290 lb)  BMI 35.31 kg/m2  SpO2 93%  General appearance: Obese white male currently in no acute distress Head: Normocephalic, without obvious abnormality, atraumatic Eyes: conjunctivae/corneas clear. PERRL, EOM's intact. Fundi not examined  Neck: no adenopathy, no carotid bruit, no JVD and supple, symmetrical, trachea midline Lungs: Reduced breath sounds at the bases Heart: regular rate and rhythm, S1, S2 normal, no murmur, click, rub or gallop Abdomen: soft, non-tender; bowel sounds normal; no masses,  no organomegaly Rectal: deferred Extremities: extremities normal, atraumatic, no cyanosis or edema Pulses: 2+ and symmetric Neurologic: Grossly  normal  Labs: CBC  Recent Labs  01/07/15 2306  WBC 9.8  RBC 4.71  HGB 14.9  HCT 43.9  PLT 96*  MCV 93.2  MCH 31.6  MCHC 33.9  RDW 13.2   CMP   Recent Labs  01/07/15 2306  NA 132*  K 3.8  CL 101  CO2 25  GLUCOSE 150*  BUN 16  CREATININE 0.86  CALCIUM 8.7  GFRNONAA >90  GFRAA >90   Troponin (Point of Care Test)  Recent Labs  01/07/15 2315  TROPIPOC 0.00    Radiology: Shallow inspiration with atelectasis or fibrosis in bases  EKG: Sinus rhythm, significant baseline artifact, nonspecific ST changes  Signed:  W. Ashley Royalty MD [facility]   Cardiology Consultant  01/08/2015, 3:54 AM

## 2015-01-09 ENCOUNTER — Ambulatory Visit (HOSPITAL_COMMUNITY): Payer: PRIVATE HEALTH INSURANCE

## 2015-01-09 DIAGNOSIS — K5090 Crohn's disease, unspecified, without complications: Secondary | ICD-10-CM | POA: Diagnosis present

## 2015-01-09 DIAGNOSIS — Z79899 Other long term (current) drug therapy: Secondary | ICD-10-CM | POA: Diagnosis not present

## 2015-01-09 DIAGNOSIS — I82442 Acute embolism and thrombosis of left tibial vein: Secondary | ICD-10-CM | POA: Diagnosis present

## 2015-01-09 DIAGNOSIS — Z87891 Personal history of nicotine dependence: Secondary | ICD-10-CM | POA: Diagnosis not present

## 2015-01-09 DIAGNOSIS — I82409 Acute embolism and thrombosis of unspecified deep veins of unspecified lower extremity: Secondary | ICD-10-CM | POA: Diagnosis present

## 2015-01-09 DIAGNOSIS — I82402 Acute embolism and thrombosis of unspecified deep veins of left lower extremity: Secondary | ICD-10-CM

## 2015-01-09 DIAGNOSIS — I10 Essential (primary) hypertension: Secondary | ICD-10-CM | POA: Diagnosis present

## 2015-01-09 DIAGNOSIS — Z7982 Long term (current) use of aspirin: Secondary | ICD-10-CM | POA: Diagnosis not present

## 2015-01-09 DIAGNOSIS — F1099 Alcohol use, unspecified with unspecified alcohol-induced disorder: Secondary | ICD-10-CM | POA: Diagnosis present

## 2015-01-09 DIAGNOSIS — Z7951 Long term (current) use of inhaled steroids: Secondary | ICD-10-CM | POA: Diagnosis not present

## 2015-01-09 DIAGNOSIS — E669 Obesity, unspecified: Secondary | ICD-10-CM | POA: Diagnosis present

## 2015-01-09 DIAGNOSIS — Z6835 Body mass index (BMI) 35.0-35.9, adult: Secondary | ICD-10-CM | POA: Diagnosis not present

## 2015-01-09 DIAGNOSIS — R079 Chest pain, unspecified: Secondary | ICD-10-CM

## 2015-01-09 DIAGNOSIS — J449 Chronic obstructive pulmonary disease, unspecified: Secondary | ICD-10-CM | POA: Diagnosis present

## 2015-01-09 DIAGNOSIS — R0602 Shortness of breath: Secondary | ICD-10-CM

## 2015-01-09 MED ORDER — TECHNETIUM TC 99M SESTAMIBI GENERIC - CARDIOLITE
30.0000 | Freq: Once | INTRAVENOUS | Status: AC | PRN
  Administered 2015-01-09: 30 via INTRAVENOUS

## 2015-01-09 MED ORDER — HEPARIN BOLUS VIA INFUSION
6000.0000 [IU] | Freq: Once | INTRAVENOUS | Status: AC
  Administered 2015-01-09: 6000 [IU] via INTRAVENOUS
  Filled 2015-01-09: qty 6000

## 2015-01-09 MED ORDER — HEPARIN (PORCINE) IN NACL 100-0.45 UNIT/ML-% IJ SOLN
1850.0000 [IU]/h | INTRAMUSCULAR | Status: DC
  Administered 2015-01-09: 1850 [IU]/h via INTRAVENOUS
  Filled 2015-01-09 (×2): qty 250

## 2015-01-09 MED ORDER — RIVAROXABAN 15 MG PO TABS
15.0000 mg | ORAL_TABLET | Freq: Two times a day (BID) | ORAL | Status: DC
  Administered 2015-01-09 – 2015-01-10 (×2): 15 mg via ORAL
  Filled 2015-01-09 (×4): qty 1

## 2015-01-09 MED ORDER — RIVAROXABAN 20 MG PO TABS: 20.0000 mg | ORAL_TABLET | Freq: Every day | ORAL | Status: DC

## 2015-01-09 MED ORDER — RIVAROXABAN (XARELTO) EDUCATION KIT FOR DVT/PE PATIENTS
PACK | Freq: Once | Status: DC
  Filled 2015-01-09: qty 1

## 2015-01-09 NOTE — Progress Notes (Signed)
[facility] PROGRESS NOTE  Rick Meadows:[id number] DOB: 02-15-1956 DOA: 01/07/2015 PCP: Verne Carrow, MD Interim summary: 59 year old male admitted for chest pain. He had cardiac cath done in 2012 and showed clean coronaries. But be reports left sided heaviness and chest pain with radiation tot he left arm on activity. ACS ruled out. Cardiology consulted and NM stress test ordered .it was negative for acute ischemia. He was further evaluated for DVT. His left lower extremity dopplers show acute DVT.  Assessment/Plan: 1. Chest pain: - resolved.   2. Left lower extremity DVT: - s\initailly started on IV heparin , later on transitioned po xarelto.   3. COPD: - no wheezing heard. Resume dulera.    4. Hypertension:  Better controlled.     Code Status: full code.  Family Communication: family at bedside Disposition Plan: pending.    Consultants:  CARDIOLOGY.  Procedures: Stress test.  Antibiotics:  None.  HPI/Subjective: Chest pain resolved.   Objective: Filed Vitals:   01/09/15 1306  BP: 126/66  Pulse: 68  Temp: 99.1 F (37.3 C)  Resp: 18    Intake/Output Summary (Last 24 hours) at 01/09/15 1853 Last data filed at 01/09/15 1800  Gross per 24 hour  Intake 686.95 ml  Output      0 ml  Net 686.95 ml   Filed Weights   01/07/15 2301 01/08/15 0500  Weight: 131.543 kg (290 lb) 131.362 kg (289 lb 9.6 oz)    Exam:   General:  Alert afebrile comfortable  Cardiovascular s1s2  Respiratory: ctab  Abdomen: soft non tender non distended bowel sounds heard  Musculoskeletal: trace  pedal edema.   Data Reviewed: Basic Metabolic Panel:  Recent Labs Lab 01/07/15 2306  NA 132*  K 3.8  CL 101  CO2 25  GLUCOSE 150*  BUN 16  CREATININE 0.86  CALCIUM 8.7   Liver Function Tests: No results for input(s): AST, ALT, ALKPHOS, BILITOT, PROT, ALBUMIN in the last 168 hours. No results for input(s): LIPASE, AMYLASE in the last 168 hours. No  results for input(s): AMMONIA in the last 168 hours. CBC:  Recent Labs Lab 01/07/15 2306  WBC 9.8  HGB 14.9  HCT 43.9  MCV 93.2  PLT 96*   Cardiac Enzymes:  Recent Labs Lab 01/08/15 0421 01/08/15 0927 01/08/15 1500  TROPONINI <0.03 <0.03 <0.03   BNP (last 3 results) No results for input(s): PROBNP in the last 8760 hours. CBG: No results for input(s): GLUCAP in the last 168 hours.  No results found for this or any previous visit (from the past 240 hour(s)).   Studies: Dg Chest 2 View  01/07/2015   CLINICAL DATA:  Left-sided chest and arm pain with shortness of breath tonight. History of hypertension and COPD.  EXAM: CHEST  2 VIEW  COMPARISON:  12/17/2014  FINDINGS: Normal heart size and pulmonary vascularity. Linear fibrosis or atelectasis in the lung bases. No focal airspace consolidation. Probable old left rib fractures. Tortuous aorta. Mediastinal contours appear intact. No blunting of costophrenic angles. No pneumothorax.  IMPRESSION: Shallow inspiration with atelectasis or fibrosis in the lung bases. No active consolidation.   Electronically Signed   By: Burman Nieves M.D.   On: 01/07/2015 23:35   Nm Myocar Multi W/spect W/wall Motion / Ef  01/09/2015   CLINICAL DATA:  59 year old with chest discomfort. 2010 cardiac catheterization unremarkable. Nuclear stress test at [facility] approximately 2 weeks ago with no ischemia. Cardiac biomarkers normal.  EXAM: MYOCARDIAL IMAGING WITH SPECT (  REST AND PHARMACOLOGIC-STRESS - 2 DAY PROTOCOL)  GATED LEFT VENTRICULAR WALL MOTION STUDY  LEFT VENTRICULAR EJECTION FRACTION  TECHNIQUE: Standard myocardial SPECT imaging was performed after resting intravenous injection of 10 mCi Tc-62m sestamibi. Subsequently, on a second day, intravenous infusion of Lexiscan was performed under the supervision of the Cardiology staff. At peak effect of the drug, 30 mCi Tc-35m sestamibi was injected intravenously and standard myocardial SPECT imaging  was performed. Quantitative gated imaging was also performed to evaluate left ventricular wall motion, and estimate left ventricular ejection fraction.  COMPARISON:  None.  FINDINGS: Perfusion: No decreased activity in the left ventricle on stress imaging to suggest reversible ischemia or infarction. Homogeneous radiotracer uptake at rest and stress.  Wall Motion: Normal left ventricular wall motion. No left ventricular dilation.  Left Ventricular Ejection Fraction: 62 %  End diastolic volume 106 ml  End systolic volume 41 ml  IMPRESSION: 1. No reversible ischemia or infarction.  2. Normal left ventricular wall motion.  3. Left ventricular ejection fraction 62%  4. Low-risk stress test findings*.  *2012 Appropriate Use Criteria for Coronary Revascularization Focused Update: J Am Coll Cardiol. 2012;59(9):857-881. http://content.dementiazones.com.aspx?articleid=1201161   Electronically Signed   By: Donato Schultz   On: 01/09/2015 09:34    Scheduled Meds: . aspirin EC  325 mg Oral Daily  . benazepril  20 mg Oral Daily   And  . hydrochlorothiazide  25 mg Oral Daily  . diltiazem  240 mg Oral Daily  . escitalopram  20 mg Oral Daily  . folic acid  1 mg Oral Daily  . Mesalamine  1,600 mg Oral BID WC  . metoprolol tartrate  25 mg Oral BID  . mometasone-formoterol  2 puff Inhalation BID  . multivitamin with minerals  1 tablet Oral Daily  . pantoprazole  40 mg Oral Daily  . thiamine  100 mg Oral Daily   Or  . thiamine  100 mg Intravenous Daily  . traZODone  50 mg Oral QHS   Continuous Infusions: . heparin 1,850 Units/hr (01/09/15 1800)    Principal Problem:   Chest pain Active Problems:   COPD (chronic obstructive pulmonary disease)   HTN (hypertension)   Crohn's disease   ETOH abuse   Pain in the chest   DVT (deep venous thrombosis)    Time spent: 35 min    Rick Meadows  [facility] Pager [phone]. If 7PM-7AM, please contact night-coverage at [email or web address], password  [facility] 01/09/2015, 6:53 PM  LOS: 2 days

## 2015-01-09 NOTE — Progress Notes (Signed)
ANTICOAGULATION CONSULT NOTE - Initial Consult  Pharmacy Consult for Xarelto Indication: DVT  No Known Allergies  Patient Measurements: Height:  (193 cm) Weight: 289 lb 9.6 oz (131.362 kg) IBW/kg (Calculated) : 86.8 Heparin Dosing Weight: 115 kg  Vital Signs: Temp: 99.1 F (37.3 C) (01/26 1306) Temp Source: Oral (01/26 1306) BP: 126/66 mmHg (01/26 1306) Pulse Rate: 68 (01/26 1306)  Labs:  Recent Labs  01/07/15 2306 01/08/15 0421 01/08/15 0927 01/08/15 1500  HGB 14.9  --   --   --   HCT 43.9  --   --   --   PLT 96*  --   --   --   CREATININE 0.86  --   --   --   TROPONINI  --  <0.03 <0.03 <0.03    Estimated Creatinine Clearance: 141.9 mL/min (by C-G formula based on Cr of 0.86).   Medical History: Past Medical History  Diagnosis Date  . Hypertension   . COPD (chronic obstructive pulmonary disease)   . Crohn's disease   . Obesity (BMI 30-39.9)     Assessment: 58 YOM admitted for chest pain and SOB, s/p stress test. LE doppler positive for deep vein thrombosis involving the left posterior tibial veins. Pharmacy is consulted to start IV heparin. He was not on anticoagulation PTA, hgb 14.9, plt slightly low at 96k, sq heparin was ordered for VTE prophylaxis, but he hasn't received any dose yet.  Pharmacy is consulted to transition heparin to rivaroxaban for DVT treatment. Hgb 14.9, Plt 96, sCr 0.86  Goal of Therapy:  Monitor platelets by anticoagulation protocol: Yes   Plan:  -Stop heparin -Xarelto  PO BID with food for 21 days followed by  once daily with food -Monitor s/sx of bleeding -Educate pt on Xarelto  Uzoma Vivona M. Newman Pies, PharmD Clinical Pharmacist Pager [phone]  01/09/2015,7:01 PM

## 2015-01-09 NOTE — Care Management Note (Signed)
    Page 1 of 1   01/09/2015     4:32:19 PM CARE MANAGEMENT NOTE 01/09/2015  Patient:  Rick Meadows, Rick Meadows   Account Number:  [id number]  Date Initiated:  01/09/2015  Documentation initiated by:  Donn Pierini  Subjective/Objective Assessment:   Pt admitted with DVT     Action/Plan:   PTA pt lived at home   Anticipated DC Date:  01/10/2015   Anticipated DC Plan:  HOME/SELF CARE      DC Planning Services  CM consult  Medication Assistance      Choice offered to / List presented to:             Status of service:  Completed, signed off Medicare Important Message given?   (If response is "NO", the following Medicare IM given date fields will be blank) Date Medicare IM given:   Medicare IM given by:   Date Additional Medicare IM given:   Additional Medicare IM given by:    Discharge Disposition:  HOME/SELF CARE  Per UR Regulation:  Reviewed for med. necessity/level of care/duration of stay  If discussed at Long Length of Stay Meetings, dates discussed:    Comments:  01/09/15- 1600- Donn Pierini RN, BSN [phone] Referral for Xarelto benefit check- 01/09/2015 Tommie Ard (Pharmacy Contractor for [employer]). Talked to [facility] (Ref # [id number]). XARELTO is covered. No prior authorization required.  x 3 weeks (42 quantity) has a co-payment of $20.00.  Daily also has a co-payment of $20.00. Patient made obtain this medication at a retail pharmacy of their choice. MD notified of above- spoke with pt and explained benefits- 30 day free card given to pt along with copay assist. card

## 2015-01-09 NOTE — Progress Notes (Signed)
*  Preliminary Results* Bilateral lower extremity venous duplex completed. The right lower extremity is negative for deep vein thrombosis. The left lower extremity is positive for deep vein thrombosis involving the left posterior tibial veins.  Preliminary results discussed with Waynard Edwards, RN.  01/09/2015  Gertie Fey, RVT, RDCS, RDMS

## 2015-01-09 NOTE — Progress Notes (Signed)
ANTICOAGULATION CONSULT NOTE - Initial Consult  Pharmacy Consult for Heparin Indication: DVT  No Known Allergies  Patient Measurements: Height: 6\' 4"  (193 cm) Weight: 289 lb 9.6 oz (131.362 kg) IBW/kg (Calculated) : 86.8 Heparin Dosing Weight: 115 kg  Vital Signs: Temp: 98.4 F (36.9 C) (01/26 0501) Temp Source: Oral (01/26 0501) BP: 143/67 mmHg (01/26 0501) Pulse Rate: 74 (01/26 0501)  Labs:  Recent Labs  01/07/15 2306 01/08/15 0421 01/08/15 0927 01/08/15 1500  HGB 14.9  --   --   --   HCT 43.9  --   --   --   PLT 96*  --   --   --   CREATININE 0.86  --   --   --   TROPONINI  --  <0.03 <0.03 <0.03    Estimated Creatinine Clearance: 141.9 mL/min (by C-G formula based on Cr of 0.86).   Medical History: Past Medical History  Diagnosis Date  . Hypertension   . COPD (chronic obstructive pulmonary disease)   . Crohn's disease   . Obesity (BMI 30-39.9)     Assessment: 58 YOM admitted for chest pain and SOB, s/p stress test. LE doppler positive for deep vein thrombosis involving the left posterior tibial veins. Pharmacy is consulted to start IV heparin. He was not on anticoagulation PTA, hgb 14.9, plt slightly low at 96k, sq heparin was ordered for VTE prophylaxis, but he hasn't received any dose yet.   Goal of Therapy:  Heparin level 0.3-0.7 units/ml Monitor platelets by anticoagulation protocol: Yes   Plan:  - Heparin bolus 6000 units, then heparin infusion 1850 units/hr - f/u 6 hr heparin level at 2000 - daily heparin level and CBC, monitor plt closely - f/u plan for oral anticoagulation. - d/c sq heparin   Bayard Hugger, PharmD, BCPS  Clinical Pharmacist  Pager: [phone]   01/09/2015,12:51 PM

## 2015-01-10 LAB — CBC
HEMATOCRIT: 40.5 % (ref 39.0–52.0)
HEMOGLOBIN: 13.9 g/dL (ref 13.0–17.0)
MCH: 32.2 pg (ref 26.0–34.0)
MCHC: 34.3 g/dL (ref 30.0–36.0)
MCV: 93.8 fL (ref 78.0–100.0)
PLATELETS: 90 10*3/uL — AB (ref 150–400)
RBC: 4.32 MIL/uL (ref 4.22–5.81)
RDW: 13 % (ref 11.5–15.5)
WBC: 6.2 10*3/uL (ref 4.0–10.5)

## 2015-01-10 MED ORDER — RIVAROXABAN (XARELTO) VTE STARTER PACK (15 & 20 MG): ORAL_TABLET | ORAL | Status: AC

## 2015-01-10 NOTE — Discharge Summary (Signed)
Physician Discharge Summary  Rick Meadows LTR:[id number] DOB: 08-Dec-1956 DOA: 01/07/2015  PCP: Verne Carrow, MD  Admit date: 01/07/2015 Discharge date: 01/10/2015  Time spent: 45 minutes  Recommendations for Outpatient Follow-up:  Patient will be discharged to home. He will need to follow up with his primary care doctor with discharge. Patient should also continue his medications as prescribed. Patient should a heart healthy diet. Patient may resume activity as tolerated.  Discharge Diagnoses:  Principal Problem:   Chest pain Active Problems:   COPD (chronic obstructive pulmonary disease)   HTN (hypertension)   Crohn's disease   ETOH abuse   Pain in the chest   DVT (deep venous thrombosis)   Discharge Condition: Stable  Diet recommendation: Heart healthy  Filed Weights   01/07/15 2301 01/08/15 0500  Weight: 131.543 kg (290 lb) 131.362 kg (289 lb 9.6 oz)    History of present illness:  By Dr. Lyda Perone on 01/08/2015 Rick Meadows is a 59 y.o. male who presents to the ED with c/o chest pain and SOB. Symptoms onset this afternoon. Pain is a "heaviness" rated 6/10 in his left chest. SOB worse with ambulation. Had to use a motorized scooter today at [facility] due to his symptoms which is unusual for him. Left hand began tingling tonight.  Was admitted to [facility] 2 weeks PTA and diagnosed with COPD and started on steroid taper which ended 3 days ago. He also had a stress test during that admit which was apparently negative.  Hospital Course:  Chest Pain  -Resolved -Cardiology was consulted and appreciated -patient did have exercise stress test: Patient had no chest pain during test, no EKG changes -Like CT scan showed EF 62%, no reversible ischemia or infarction, low risk stress test findings -Continue aspirin  Left Lower Ext DVT -Lower ext doppler: RLE negative for DVT, LLE positive for DVT involving the left posterior tibial veins -Initially started on  IV heparin, transitioned to Xarelto -Patient does have family history of PE and DVT -Patient would benefit from an outpatient hypercoagulable workup  COPD -Remained stable, negative for wheezing at this time -Continue to Advair albuterol  Hypertension -Continue [place] protocol/HCTZ, diltiazem   Procedures: Exercise stress/Lexiscan  Consultations: Cardiology  Discharge Exam: Filed Vitals:   01/10/15 0416  BP: 120/63  Pulse: 61  Temp: 98.6 F (37 C)  Resp: 18     General: Well developed, well nourished, NAD, appears stated age  HEENT: NCAT, mucous membranes moist.  Cardiovascular: S1 S2 auscultated, no rubs, murmurs or gallops. Regular rate and rhythm.  Respiratory: Clear to auscultation bilaterally with equal chest rise  Abdomen: Soft, nontender, nondistended, + bowel sounds  Extremities: warm dry without cyanosis clubbing or edema  Neuro: AAOx3, nonfocal  Psych: Normal affect and demeanor with intact judgement and insight  Discharge Instructions      Discharge Instructions    Discharge instructions    Complete by:  As directed   Patient will be discharged to home. He will need to follow up with his primary care doctor with discharge. Patient should also continue his medications as prescribed. Patient should a heart healthy diet. Patient may resume activity as tolerated.            Medication List    TAKE these medications        albuterol 108 (90 BASE) MCG/ACT inhaler  Commonly known as:  PROVENTIL HFA;VENTOLIN HFA  Inhale 2 puffs into the lungs every 6 (six) hours as needed for wheezing or shortness of breath.  aspirin 325 MG tablet  Take 325 mg by mouth daily.     benazepril-hydrochlorthiazide 20-25 MG per tablet  Commonly known as:  LOTENSIN HCT  Take 1 tablet by mouth daily.     diltiazem 240 MG 24 hr capsule  Commonly known as:  DILACOR XR  Take 240 mg by mouth daily.     escitalopram 20 MG tablet  Commonly known as:  LEXAPRO    Take 20 mg by mouth daily.     Fluticasone-Salmeterol 250-50 MCG/DOSE Aepb  Commonly known as:  ADVAIR  Inhale 1 puff into the lungs 2 (two) times daily.     lansoprazole 30 MG capsule  Commonly known as:  PREVACID  Take 30 mg by mouth daily at 12 noon.     MENS 50+ MULTI VITAMIN/MIN PO  Take 1 tablet by mouth daily.     Mesalamine 800 MG Tbec  Take 2 tablets by mouth 2 (two) times daily.     metoprolol tartrate 25 MG tablet  Commonly known as:  LOPRESSOR  Take 25 mg by mouth 2 (two) times daily.     Rivaroxaban 15 & 20 MG Tbpk  Commonly known as:  XARELTO STARTER PACK  Take as directed on package: Start with one  tablet by mouth twice a day with food. On Day 22, switch to one  tablet once a day with food.     traZODone 50 MG tablet  Commonly known as:  DESYREL  Take 50 mg by mouth at bedtime.       No Known Allergies Follow-up Information    Follow up with MCALHANY,CHRISTOPHER, MD. Schedule an appointment as soon as possible for a visit in 1 week.   Specialty:  Cardiology   Why:  Hospital followup   Contact information:   [address]  [address] [place] [address] [phone]        The results of significant diagnostics from this hospitalization (including imaging, microbiology, ancillary and laboratory) are listed below for reference.    Significant Diagnostic Studies: Dg Chest 2 View  01/07/2015   CLINICAL DATA:  Left-sided chest and arm pain with shortness of breath tonight. History of hypertension and COPD.  EXAM: CHEST  2 VIEW  COMPARISON:  12/17/2014  FINDINGS: Normal heart size and pulmonary vascularity. Linear fibrosis or atelectasis in the lung bases. No focal airspace consolidation. Probable old left rib fractures. Tortuous aorta. Mediastinal contours appear intact. No blunting of costophrenic angles. No pneumothorax.  IMPRESSION: Shallow inspiration with atelectasis or fibrosis in the lung bases. No active consolidation.   Electronically  Signed   By: Burman Nieves M.D.   On: 01/07/2015 23:35   Nm Myocar Multi W/spect W/wall Motion / Ef  01/09/2015   CLINICAL DATA:  59 year old with chest discomfort. 2010 cardiac catheterization unremarkable. Nuclear stress test at [facility] approximately 2 weeks ago with no ischemia. Cardiac biomarkers normal.  EXAM: MYOCARDIAL IMAGING WITH SPECT (REST AND PHARMACOLOGIC-STRESS - 2 DAY PROTOCOL)  GATED LEFT VENTRICULAR WALL MOTION STUDY  LEFT VENTRICULAR EJECTION FRACTION  TECHNIQUE: Standard myocardial SPECT imaging was performed after resting intravenous injection of 10 mCi Tc-67m sestamibi. Subsequently, on a second day, intravenous infusion of Lexiscan was performed under the supervision of the Cardiology staff. At peak effect of the drug, 30 mCi Tc-35m sestamibi was injected intravenously and standard myocardial SPECT imaging was performed. Quantitative gated imaging was also performed to evaluate left ventricular wall motion, and estimate left ventricular ejection fraction.  COMPARISON:  None.  FINDINGS: Perfusion: No decreased activity in the left ventricle on stress imaging to suggest reversible ischemia or infarction. Homogeneous radiotracer uptake at rest and stress.  Wall Motion: Normal left ventricular wall motion. No left ventricular dilation.  Left Ventricular Ejection Fraction: 62 %  End diastolic volume 106 ml  End systolic volume 41 ml  IMPRESSION: 1. No reversible ischemia or infarction.  2. Normal left ventricular wall motion.  3. Left ventricular ejection fraction 62%  4. Low-risk stress test findings*.  *2012 Appropriate Use Criteria for Coronary Revascularization Focused Update: J Am Coll Cardiol. 2012;59(9):857-881. http://content.dementiazones.com.aspx?articleid=1201161   Electronically Signed   By: Donato Schultz   On: 01/09/2015 09:34    Microbiology: No results found for this or any previous visit (from the past 240 hour(s)).   Labs: Basic Metabolic Panel:  Recent  Labs Lab 01/07/15 2306  NA 132*  K 3.8  CL 101  CO2 25  GLUCOSE 150*  BUN 16  CREATININE 0.86  CALCIUM 8.7   Liver Function Tests: No results for input(s): AST, ALT, ALKPHOS, BILITOT, PROT, ALBUMIN in the last 168 hours. No results for input(s): LIPASE, AMYLASE in the last 168 hours. No results for input(s): AMMONIA in the last 168 hours. CBC:  Recent Labs Lab 01/07/15 2306 01/10/15 0546  WBC 9.8 6.2  HGB 14.9 13.9  HCT 43.9 40.5  MCV 93.2 93.8  PLT 96* 90*   Cardiac Enzymes:  Recent Labs Lab 01/08/15 0421 01/08/15 0927 01/08/15 1500  TROPONINI <0.03 <0.03 <0.03   BNP: BNP (last 3 results) No results for input(s): PROBNP in the last 8760 hours. CBG: No results for input(s): GLUCAP in the last 168 hours.     SignedEdsel Petrin  [facility] 01/10/2015, 10:21 AM

## 2015-01-10 NOTE — Progress Notes (Signed)
Discharge instructions given. Pt verbalized understanding and all questions were answered.  

## 2015-01-10 NOTE — Discharge Instructions (Signed)
Deep Vein Thrombosis °A deep vein thrombosis (DVT) is a blood clot that develops in the deep, larger veins of the leg, arm, or pelvis. These are more dangerous than clots that might form in veins near the surface of the body. A DVT can lead to serious and even life-threatening complications if the clot breaks off and travels in the bloodstream to the lungs.  °A DVT can damage the valves in your leg veins so that instead of flowing upward, the blood pools in the lower leg. This is called post-thrombotic syndrome, and it can result in pain, swelling, discoloration, and sores on the leg. °CAUSES °Usually, several things contribute to the formation of blood clots. Contributing factors include: °· The flow of blood slows down. °· The inside of the vein is damaged in some way. °· You have a condition that makes blood clot more easily. °RISK FACTORS °Some people are more likely than others to develop blood clots. Risk factors include:  °· Smoking. °· Being overweight (obese). °· Sitting or lying still for a long time. This includes long-distance travel, paralysis, or recovery from an illness or surgery. °Other factors that increase risk are:  °· Older age, especially over 75 years of age. °· Having a family history of blood clots or if you have already had a blot clot. °· Having major or lengthy surgery. This is especially true for surgery on the hip, knee, or belly (abdomen). Hip surgery is particularly high risk. °· Having a long, thin tube (catheter) placed inside a vein during a medical procedure. °· Breaking a hip or leg. °· Having cancer or cancer treatment. °· Pregnancy and childbirth. °¨ Hormone changes make the blood clot more easily during pregnancy. °¨ The fetus puts pressure on the veins of the pelvis. °¨ There is a risk of injury to veins during delivery or a caesarean delivery. The risk is highest just after childbirth. °· Medicines containing the male hormone estrogen. This includes birth control pills and  hormone replacement therapy. °· Other circulation or heart problems. ° °SIGNS AND SYMPTOMS °When a clot forms, it can either partially or totally block the blood flow in that vein. Symptoms of a DVT can include: °· Swelling of the leg or arm, especially if one side is much worse. °· Warmth and redness of the leg or arm, especially if one side is much worse. °· Pain in an arm or leg. If the clot is in the leg, symptoms may be more noticeable or worse when standing or walking. °The symptoms of a DVT that has traveled to the lungs (pulmonary embolism, PE) usually start suddenly and include: °· Shortness of breath. °· Coughing. °· Coughing up blood or blood-tinged mucus. °· Chest pain. The chest pain is often worse with deep breaths. °· Rapid heartbeat. °Anyone with these symptoms should get emergency medical treatment right away. Do not wait to see if the symptoms will go away. Call your local emergency services (911 in the [place] if you have these symptoms. Do not drive yourself to the hospital. °DIAGNOSIS °If a DVT is suspected, your health care provider will take a full medical history and perform a physical exam. Tests that also may be required include: °· Blood tests, including studies of the clotting properties of the blood. °· Ultrasound to see if you have clots in your legs or lungs. °· X-rays to show the flow of blood when dye is injected into the veins (venogram). °· Studies of your lungs if you have any   chest symptoms. °PREVENTION °· Exercise the legs regularly. Take a brisk 30-minute walk every day. °· Maintain a weight that is appropriate for your height. °· Avoid sitting or lying in bed for long periods of time without moving your legs. °· Women, particularly those over the age of 35 years, should consider the risks and benefits of taking estrogen medicines, including birth control pills. °· Do not smoke, especially if you take estrogen medicines. °· Long-distance travel can increase your risk of DVT. You  should exercise your legs by walking or pumping the muscles every hour. °· Many of the risk factors above relate to situations that exist with hospitalization, either for illness, injury, or elective surgery. Prevention may include medical and nonmedical measures. °¨ Your health care provider will assess you for the need for venous thromboembolism prevention when you are admitted to the hospital. If you are having surgery, your surgeon will assess you the day of or day after surgery. °TREATMENT °Once identified, a DVT can be treated. It can also be prevented in some circumstances. Once you have had a DVT, you may be at increased risk for a DVT in the future. The most common treatment for DVT is blood-thinning (anticoagulant) medicine, which reduces the blood's tendency to clot. Anticoagulants can stop new blood clots from forming and stop old clots from growing. They cannot dissolve existing clots. Your body does this by itself over time. Anticoagulants can be given by mouth, through an IV tube, or by injection. Your health care provider will determine the best program for you. Other medicines or treatments that may be used are: °· Heparin or related medicines (low molecular weight heparin) are often the first treatment for a blood clot. They act quickly. However, they cannot be taken orally and must be given either in shot form or by IV tube. °¨ Heparin can cause a fall in a component of blood that stops bleeding and forms blood clots (platelets). You will be monitored with blood tests to be sure this does not occur. °· Warfarin is an anticoagulant that can be swallowed. It takes a few days to start working, so usually heparin or related medicines are used in combination. Once warfarin is working, heparin is usually stopped. °· Factor Xa inhibitor medicines, such as rivaroxaban and apixaban, also reduce blood clotting. These medicines are taken orally and can often be used without heparin or related  medicines. °· Less commonly, clot dissolving drugs (thrombolytics) are used to dissolve a DVT. They carry a high risk of bleeding, so they are used mainly in severe cases where your life or a part of your body is threatened. °· Very rarely, a blood clot in the leg needs to be removed surgically. °· If you are unable to take anticoagulants, your health care provider may arrange for you to have a filter placed in a main vein in your abdomen. This filter prevents clots from traveling to your lungs. °HOME CARE INSTRUCTIONS °· Take all medicines as directed by your health care provider. °· Learn as much as you can about DVT. °· Wear a medical alert bracelet or carry a medical alert card. °· Ask your health care provider how soon you can go back to normal activities. It is important to stay active to prevent blood clots. If you are on anticoagulant medicine, avoid contact sports. °· It is very important to exercise. This is especially important while traveling, sitting, or standing for long periods of time. Exercise your legs by walking or by   tightening and relaxing your leg muscles regularly. Take frequent walks. °· You may need to wear compression stockings. These are tight elastic stockings that apply pressure to the lower legs. This pressure can help keep the blood in the legs from clotting. °Taking Warfarin °Warfarin is a daily medicine that is taken by mouth. Your health care provider will advise you on the length of treatment (usually 3-6 months, sometimes lifelong). If you take warfarin: °· Understand how to take warfarin and foods that can affect how warfarin works in your body. °· Too much and too little warfarin are both dangerous. Too much warfarin increases the risk of bleeding. Too little warfarin continues to allow the risk for blood clots. °Warfarin and Regular Blood Testing °While taking warfarin, you will need to have regular blood tests to measure your blood clotting time. These blood tests usually  include both the prothrombin time (PT) and international normalized ratio (INR) tests. The PT and INR results allow your health care provider to adjust your dose of warfarin. It is very important that you have your PT and INR tested as often as directed by your health care provider.    °Warfarin and Your Diet °Avoid major changes in your diet, or notify your health care provider before changing your diet. Arrange a visit with a registered dietitian to answer your questions. Many foods, especially foods high in vitamin K, can interfere with warfarin and affect the PT and INR results. You should eat a consistent amount of foods high in vitamin K. Foods high in vitamin K include:  °· Spinach, kale, broccoli, cabbage, collard and turnip greens, Brussels sprouts, peas, cauliflower, seaweed, and parsley. °· Beef and pork liver. °· Green tea. °· Soybean oil. °Warfarin with Other Medicines °Many medicines can interfere with warfarin and affect the PT and INR results. You must: °· Tell your health care provider about any and all medicines, vitamins, and supplements you take, including aspirin and other over-the-counter anti-inflammatory medicines. Be especially cautious with aspirin and anti-inflammatory medicines. Ask your health care provider before taking these. °· Do not take or discontinue any prescribed or over-the-counter medicine except on the advice of your health care provider or pharmacist. °Warfarin Side Effects °Warfarin can have side effects, such as easy bruising and difficulty stopping bleeding. Ask your health care provider or pharmacist about other side effects of warfarin. You will need to: °· Hold pressure over cuts for longer than usual. °· Notify your dentist and other health care providers that you are taking warfarin before you undergo any procedures where bleeding may occur. °Warfarin with Alcohol and Tobacco  °· Drinking alcohol frequently can increase the effect of warfarin, leading to excess  bleeding. It is best to avoid alcoholic drinks or to consume only very small amounts while taking warfarin. Notify your health care provider if you change your alcohol intake.   °· Do not use any tobacco products including cigarettes, chewing tobacco, or electronic cigarettes. If you smoke, quit. Ask your health care provider for help with quitting smoking. °Alternative Medicines to Warfarin: Factor Xa Inhibitor Medicines °· These blood-thinning medicines are taken by mouth, usually for several weeks or longer. It is important to take the medicine every single day at the same time each day. °· There are no regular blood tests required when using these medicines. °· There are fewer food and drug interactions than with warfarin. °· The side effects of this class of medicine are similar to those of warfarin, including excessive bruising or bleeding. Ask your   health care provider or pharmacist about other potential side effects. SEEK MEDICAL CARE IF:  You notice a rapid heartbeat.  You feel weaker or more tired than usual.  You feel faint.  You notice increased bruising.  You feel your symptoms are not getting better in the time expected.  You believe you are having side effects of medicine. SEEK IMMEDIATE MEDICAL CARE IF:  You have chest pain.  You have trouble breathing.  You have new or increased swelling or pain in one leg.  You cough up blood.  You notice blood in vomit, in a bowel movement, or in urine. MAKE SURE YOU:  Understand these instructions.  Will watch your condition.  Will get help right away if you are not doing well or get worse. Document Released: 12/01/2005 Document Revised: 04/17/2014 Document Reviewed: 08/08/2013 [facility] Patient Information 2015 [place], [place]. This information is not intended to replace advice given to you by your health care provider. Make sure you discuss any questions you have with your health care provider.   Information on my medicine -  XARELTO (rivaroxaban)  This medication education was reviewed with me or my healthcare representative as part of my discharge preparation.    WHY WAS XARELTO PRESCRIBED FOR YOU? Xarelto was prescribed to treat blood clots that may have been found in the veins of your legs (deep vein thrombosis) or in your lungs (pulmonary embolism) and to reduce the risk of them occurring again.  What do you need to know about Xarelto? The starting dose is one 15 mg tablet taken TWICE daily with food for the FIRST 21 DAYS then on 01/31/2015 the dose is changed to one 20 mg tablet taken ONCE A DAY with your evening meal.  DO NOT stop taking Xarelto without talking to the health care provider who prescribed the medication.  Refill your prescription for 20 mg tablets before you run out.  After discharge, you should have regular check-up appointments with your healthcare provider that is prescribing your Xarelto.  In the future your dose may need to be changed if your kidney function changes by a significant amount.  What do you do if you miss a dose? If you are taking Xarelto TWICE DAILY and you miss a dose, take it as soon as you remember. You may take two 15 mg tablets (total 30 mg) at the same time then resume your regularly scheduled 15 mg twice daily the next day.  If you are taking Xarelto ONCE DAILY and you miss a dose, take it as soon as you remember on the same day then continue your regularly scheduled once daily regimen the next day. Do not take two doses of Xarelto at the same time.   Important Safety Information Xarelto is a blood thinner medicine that can cause bleeding. You should call your healthcare provider right away if you experience any of the following: ? Bleeding from an injury or your nose that does not stop. ? Unusual colored urine (red or dark brown) or unusual colored stools (red or black). ? Unusual bruising for unknown reasons. ? A serious fall or if you hit your head (even  if there is no bleeding).  Some medicines may interact with Xarelto and might increase your risk of bleeding while on Xarelto. To help avoid this, consult your healthcare provider or pharmacist prior to using any new prescription or non-prescription medications, including herbals, vitamins, non-steroidal anti-inflammatory drugs (NSAIDs) and supplements.  This website has more information on Xarelto: VisitDestination.com.br.

## 2015-02-02 ENCOUNTER — Ambulatory Visit (INDEPENDENT_AMBULATORY_CARE_PROVIDER_SITE_OTHER): Payer: PRIVATE HEALTH INSURANCE | Admitting: Cardiovascular Disease

## 2015-02-02 ENCOUNTER — Encounter: Payer: Self-pay | Admitting: Cardiovascular Disease

## 2015-02-02 ENCOUNTER — Encounter: Payer: Self-pay | Admitting: Nurse Practitioner

## 2015-02-02 VITALS — BP 144/88 | HR 78 | Ht 76.0 in | Wt 289.6 lb

## 2015-02-02 DIAGNOSIS — R079 Chest pain, unspecified: Secondary | ICD-10-CM

## 2015-02-02 DIAGNOSIS — I10 Essential (primary) hypertension: Secondary | ICD-10-CM

## 2015-02-02 DIAGNOSIS — I82409 Acute embolism and thrombosis of unspecified deep veins of unspecified lower extremity: Secondary | ICD-10-CM

## 2015-02-02 NOTE — Progress Notes (Signed)
Cardiology Office Note   Date:  02/02/2015   ID:  Rick Meadows, DOB Jul 19, 1956, MRN [id number]  PCP:  Abigail Miyamoto, MD  Cardiologist:   Vesta Mixer, MD   Chief Complaint  Patient presents with  . Chest Pain   Problem list: 1. Chest pain-the patient had recent hospitalization for chest pain. He ruled out for myocardial infarction. Had a stress Myoview study that was negative for ischemia and showed normal LV function.  2. Deep vein thrombosis: He's currently on Xarelto  3. COPD: - quit smoking in 1999  4. Hypertension   History of Present Illness: Rick Meadows is a 59 y.o. male who presents for follow up of his CP. He has been seen in the hospital by Dr. Donnie Aho and Dr. Herbie Baltimore.   He was found to have a DVT.  Still has some CP and dyspnea.  Does not get any regular exercise  Is not working at present.  Has not worked but 2 days this year.  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  Doesn't drink much ETOH now.  Drank more prior to hospitlaization  - about a 6 pack a day prior   The myoview was normal  . No reversible ischemia or infarction.  2. Normal left ventricular wall motion.  3. Left ventricular ejection fraction 62%  4. Low-risk stress test findings*.  Past Medical History  Diagnosis Date  . Hypertension   . COPD (chronic obstructive pulmonary disease)   . Crohn's disease   . Obesity (BMI 30-39.9)     Past Surgical History  Procedure Laterality Date  . Foot surgery       Current Outpatient Prescriptions  Medication Sig Dispense Refill  . benazepril-hydrochlorthiazide (LOTENSIN HCT) 20-25 MG per tablet Take 1 tablet by mouth daily.    Marland Kitchen CARTIA XT 240 MG 24 hr capsule Take 240 mg by mouth daily.  6  . escitalopram (LEXAPRO) 20 MG tablet Take 20 mg by mouth daily.    . Fluticasone-Salmeterol (ADVAIR) 250-50 MCG/DOSE AEPB Inhale 1 puff into the lungs 2 (two) times daily.    . lansoprazole (PREVACID) 30 MG capsule Take 30 mg by mouth daily at 12  noon.    . mesalamine (ASACOL) 400 MG EC tablet Take 800 mg by mouth 2 (two) times daily.    . metoprolol tartrate (LOPRESSOR) 25 MG tablet Take 25 mg by mouth 2 (two) times daily.    . Rivaroxaban (XARELTO STARTER PACK) 15 & 20 MG TBPK Take as directed on package: Start with one  tablet by mouth twice a day with food. On Day 22, switch to one  tablet once a day with food. 51 each 0  . traZODone (DESYREL) 50 MG tablet Take 50 mg by mouth at bedtime.     No current facility-administered medications for this visit.    Allergies:   Review of patient's allergies indicates no known allergies.    Social History:  The patient  reports that he has quit smoking. He has never used smokeless tobacco. He reports that he drinks alcohol. He reports that he does not use illicit drugs.   Family History:  The patient's family history is not on file.    ROS:  Please see the history of present illness.    Review of Systems: Constitutional:  denies fever, chills, diaphoresis, appetite change and fatigue.  HEENT: denies photophobia, eye pain, redness, hearing loss, ear pain, congestion, sore throat, rhinorrhea, sneezing, neck pain, neck stiffness and tinnitus.  Respiratory:  admits to SOB, DOE,   Cardiovascular: denies chest pain, palpitations and leg swelling.  Gastrointestinal: denies nausea, vomiting, abdominal pain, diarrhea, constipation, blood in stool.  Genitourinary: denies dysuria, urgency, frequency, hematuria, flank pain and difficulty urinating.  Musculoskeletal: denies  myalgias, back pain, joint swelling, arthralgias and gait problem.   Skin: denies pallor, rash and wound.  Neurological: denies dizziness, seizures, syncope, weakness, light-headedness, numbness and headaches.   Hematological: denies adenopathy, easy bruising, personal or family bleeding history.  Psychiatric/ Behavioral: denies suicidal ideation, mood changes, confusion, nervousness, sleep disturbance and agitation.        All other systems are reviewed and negative.    PHYSICAL EXAM: VS:  BP 144/88 mmHg  Pulse 78  Ht 6\' 4"  (1.93 m)  Wt 289 lb 9.6 oz (131.362 kg)  BMI 35.27 kg/m2 , BMI Body mass index is 35.27 kg/(m^2). GEN: Well nourished, well developed, in no acute distress HEENT: normal Neck: no JVD, carotid bruits, or masses Cardiac: RRR; no murmurs, rubs, or gallops,no edema  Respiratory:  clear to auscultation bilaterally, normal work of breathing GI: soft, nontender, nondistended, + BS MS: no deformity or atrophy Skin: warm and dry, no rash Neuro:  Strength and sensation are intact Psych: normal   EKG:  EKG is ordered today. The ekg ordered today demonstrates NSR at 78.  NS ST abn.    Recent Labs: 01/07/2015: B Natriuretic Peptide 20.1; BUN 16; Creatinine 0.86; Potassium 3.8; Sodium 132* 01/10/2015: Hemoglobin 13.9; Platelets 90*    Lipid Panel    Component Value Date/Time   CHOL  06/23/2009 0602    162        ATP III CLASSIFICATION:  <200     mg/dL   Desirable  202-542  mg/dL   Borderline High  >=706    mg/dL   High          TRIG 237 06/23/2009 0602   HDL 37* 06/23/2009 0602   CHOLHDL 4.4 06/23/2009 0602   VLDL 23 06/23/2009 0602   LDLCALC * 06/23/2009 0602    102        Total Cholesterol/HDL:CHD Risk Coronary Heart Disease Risk Table                     Men   Women  1/2 Average Risk   3.4   3.3  Average Risk       5.0   4.4  2 X Average Risk   9.6   7.1  3 X Average Risk  23.4   11.0        Use the calculated Patient Ratio above and the CHD Risk Table to determine the patient's CHD Risk.        ATP III CLASSIFICATION (LDL):  <100     mg/dL   Optimal  628-315  mg/dL   Near or Above                    Optimal  130-159  mg/dL   Borderline  176-160  mg/dL   High  >737     mg/dL   Very High      Wt Readings from Last 3 Encounters:  02/02/15 289 lb 9.6 oz (131.362 kg)  01/08/15 289 lb 9.6 oz (131.362 kg)      Other studies Reviewed: Additional  studies/ records that were reviewed today include: . Review of the above records demonstrates:    ASSESSMENT AND PLAN:  1. Chest pain-the patient had recent  hospitalization for chest pain. He ruled out for myocardial infarction. Had a stress Myoview study that was negative for ischemia and showed normal LV function.  Pain is not pleuretic.    2. Deep vein thrombosis: He's currently on Xarelto.  He will need to follow up with Dr. Marina Goodell for the duration of Xarelto.  He will need it for at least 6 months.   3. COPD: - quit smoking in 1999  4. Hypertension - have advised him to work on a diet and exercise and weight loss program.    Current medicines are reviewed at length with the patient today.  The patient does not have concerns regarding medicines.  The following changes have been made:  no change   Disposition:   FU with Dr. Marina Goodell. He did not want to follow up with [place].  He will call if he needs [place].     Signed, Nahser, Deloris Ping, MD  02/02/2015 1:53 PM    [facility] [address] [place], [place], [place]  [address] Phone: [phone]; Fax: [phone]

## 2015-02-02 NOTE — Patient Instructions (Signed)
Your physician recommends that you continue on your current medications as directed. Please refer to the Current Medication list given to you today.  Your physician recommends that you schedule a follow-up appointment in: as needed with Dr. Nahser  

## 2016-09-18 IMAGING — DX DG CHEST 2V
2 series · 2 of 2 positions shown · non-contrast
Comparison: 12/17/2014

CLINICAL DATA: Left-sided chest and arm pain with shortness of
breath tonight. History of hypertension and COPD.

EXAM:
CHEST  2 VIEW

[chest pa]
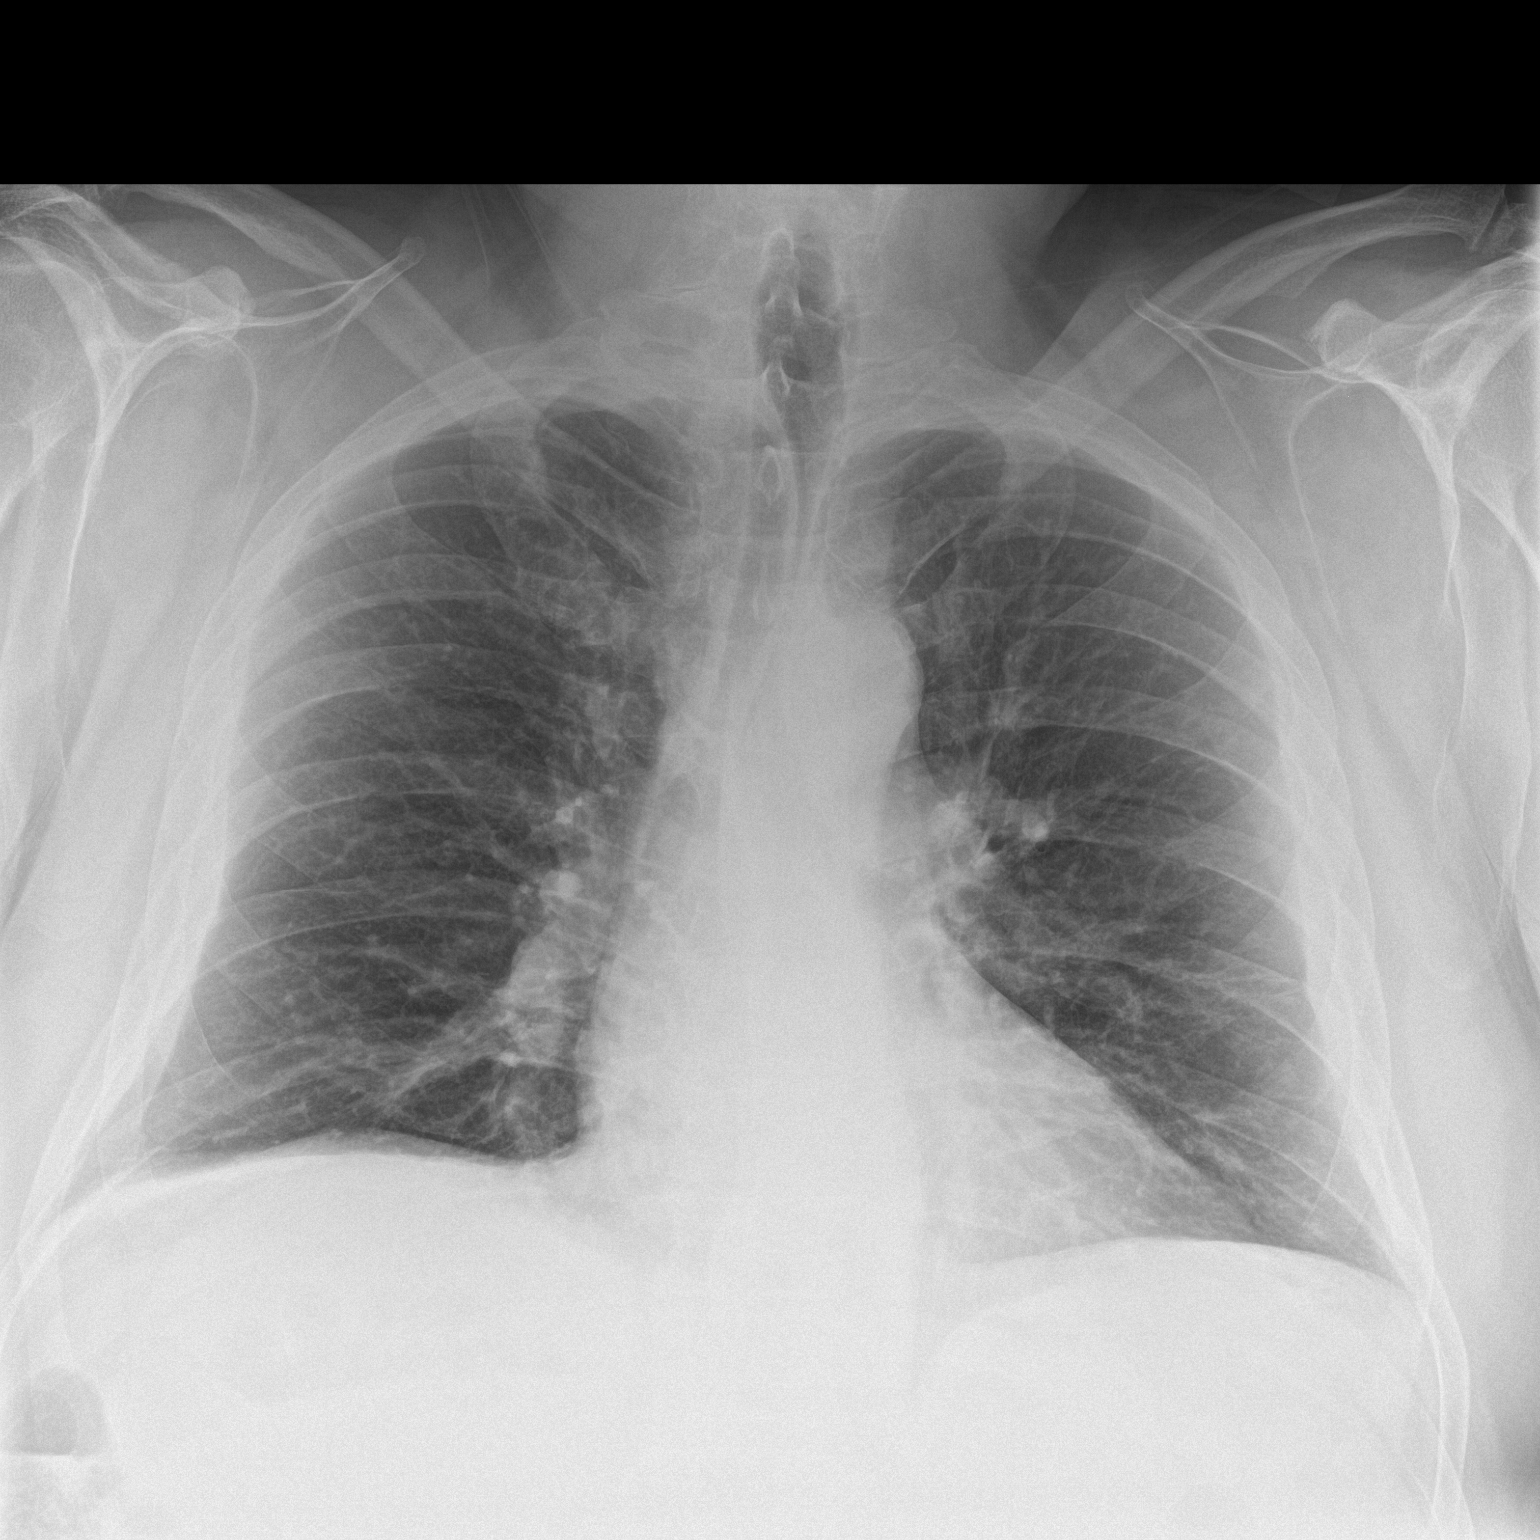

[chest lat]
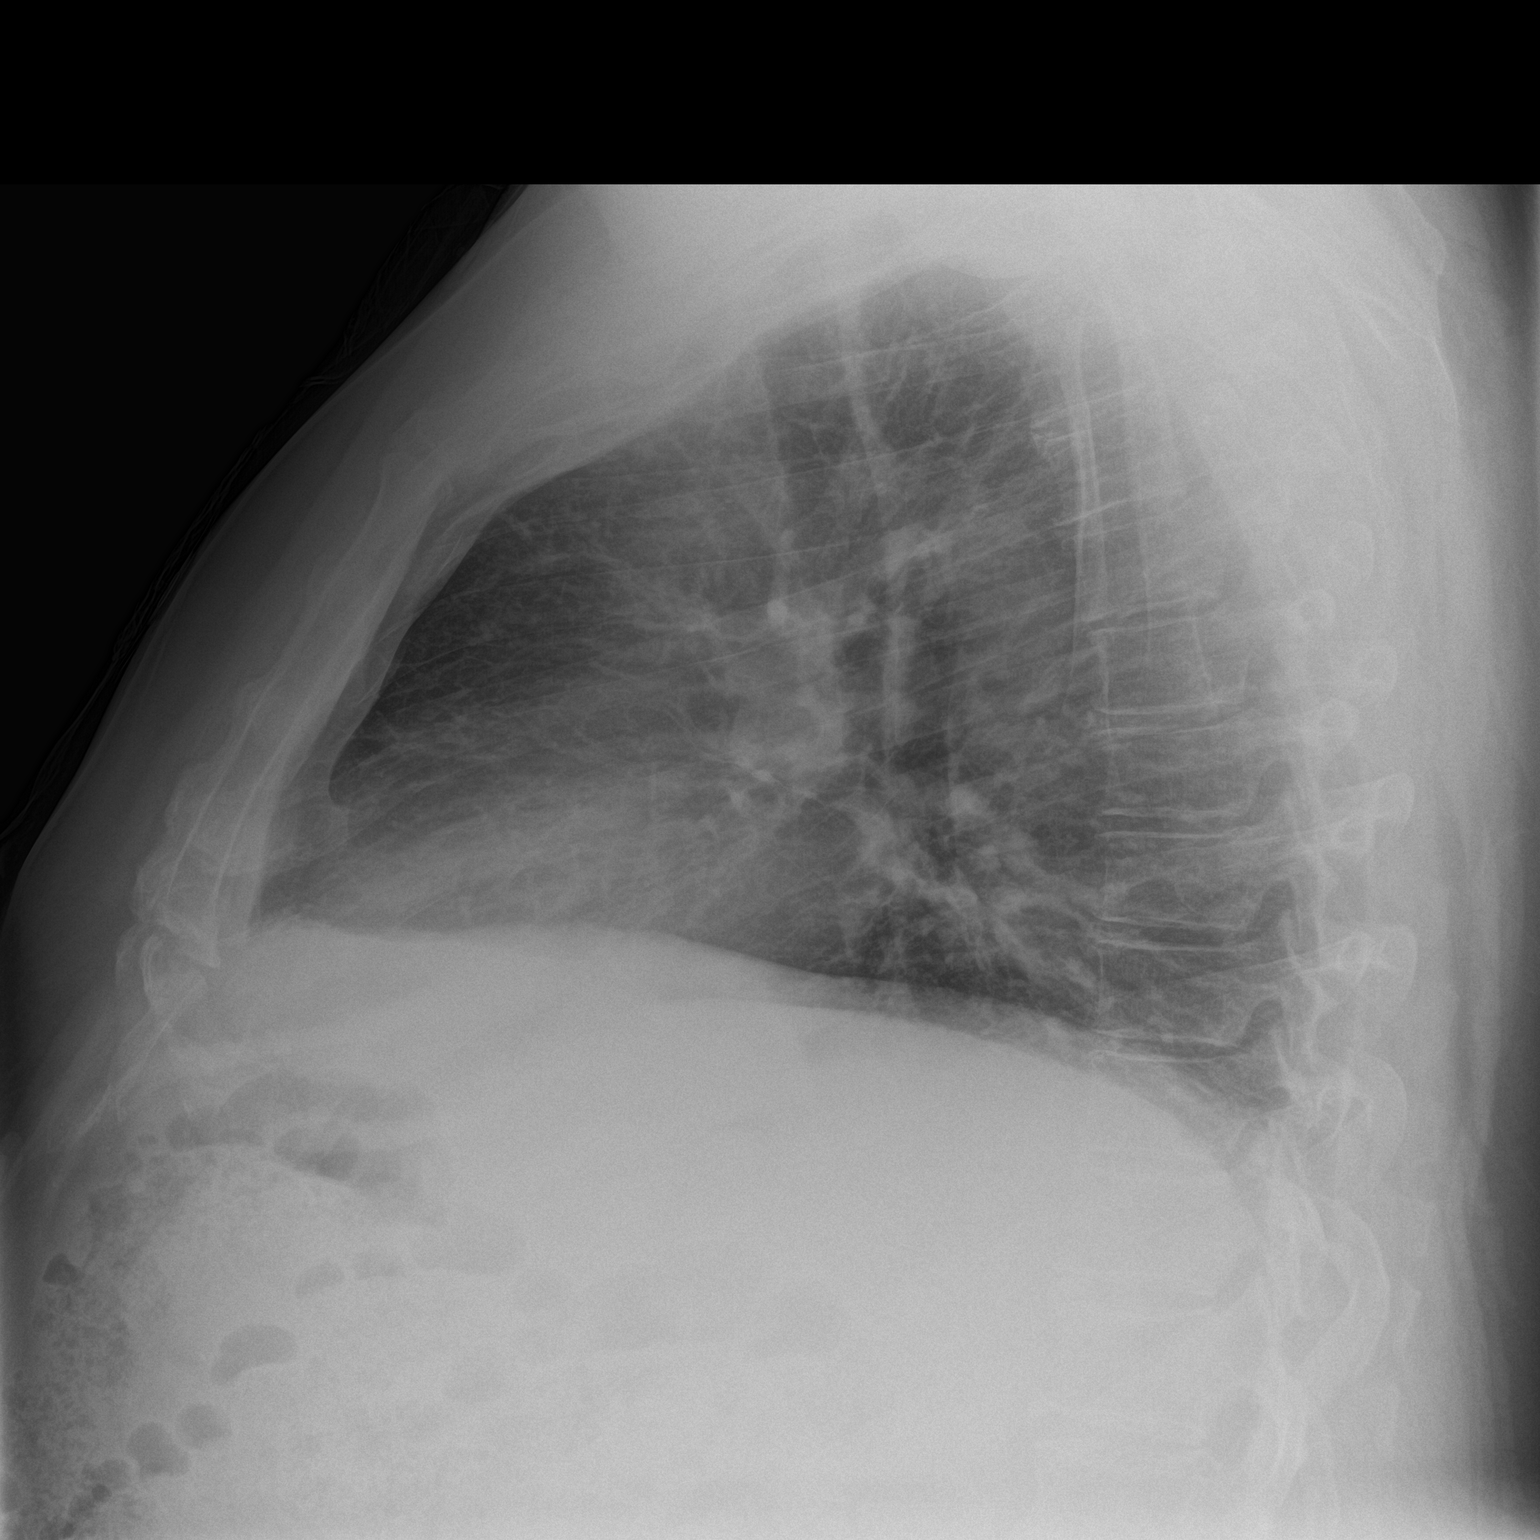

[2 of 2 positions shown; findings below may reference images not displayed]

FINDINGS: Normal heart size and pulmonary vascularity. Linear fibrosis or
atelectasis in the lung bases. No focal airspace consolidation.
Probable old left rib fractures. Tortuous aorta. Mediastinal
contours appear intact. No blunting of costophrenic angles. No
pneumothorax.
IMPRESSION: Shallow inspiration with atelectasis or fibrosis in the lung bases.
No active consolidation.

## 2017-02-12 DEATH — deceased
